# Patient Record
Sex: Female | Born: 1965 | Race: White | Hispanic: No | Marital: Married | Smoking: Never smoker
Health system: Southern US, Community
[De-identification: ages and names within clinical notes are randomized; demographics above are authoritative.]

## PROBLEM LIST (undated history)

## (undated) DIAGNOSIS — R059 Cough, unspecified: Secondary | ICD-10-CM

## (undated) DIAGNOSIS — R05 Cough: Secondary | ICD-10-CM

## (undated) DIAGNOSIS — I10 Essential (primary) hypertension: Secondary | ICD-10-CM

## (undated) HISTORY — DX: Essential (primary) hypertension: I10

## (undated) HISTORY — PX: OOPHORECTOMY: SHX86

---

## 1997-12-13 HISTORY — PX: ECTOPIC PREGNANCY SURGERY: SHX613

## 2004-01-27 ENCOUNTER — Other Ambulatory Visit: Payer: Self-pay

## 2005-08-19 ENCOUNTER — Ambulatory Visit: Payer: Self-pay | Admitting: Internal Medicine

## 2006-12-29 ENCOUNTER — Ambulatory Visit: Payer: Self-pay | Admitting: Obstetrics and Gynecology

## 2007-01-04 ENCOUNTER — Ambulatory Visit: Payer: Self-pay | Admitting: Obstetrics and Gynecology

## 2007-01-11 ENCOUNTER — Ambulatory Visit: Payer: Self-pay | Admitting: General Surgery

## 2007-01-11 HISTORY — PX: BREAST BIOPSY: SHX20

## 2007-09-06 DIAGNOSIS — F411 Generalized anxiety disorder: Secondary | ICD-10-CM | POA: Insufficient documentation

## 2009-01-01 ENCOUNTER — Ambulatory Visit: Payer: Self-pay | Admitting: Obstetrics and Gynecology

## 2009-03-04 ENCOUNTER — Ambulatory Visit: Payer: Self-pay | Admitting: Obstetrics and Gynecology

## 2009-05-22 ENCOUNTER — Ambulatory Visit: Payer: Self-pay | Admitting: Obstetrics and Gynecology

## 2009-05-30 ENCOUNTER — Ambulatory Visit: Payer: Self-pay | Admitting: Obstetrics and Gynecology

## 2010-12-29 ENCOUNTER — Ambulatory Visit: Payer: Self-pay | Admitting: Obstetrics and Gynecology

## 2010-12-31 ENCOUNTER — Ambulatory Visit: Payer: Self-pay | Admitting: Obstetrics and Gynecology

## 2011-07-15 ENCOUNTER — Ambulatory Visit: Payer: Self-pay | Admitting: Surgery

## 2012-01-04 ENCOUNTER — Ambulatory Visit: Payer: Self-pay | Admitting: Surgery

## 2012-01-06 ENCOUNTER — Ambulatory Visit: Payer: Self-pay | Admitting: Surgery

## 2012-07-29 IMAGING — MG MAM DGTL DIAGNOSTIC MAMMO W/CAD
1 series · 8 of 8 positions shown · non-contrast
Comparison: none

REASON FOR EXAM: LUMP OR MASS IN BRST FU AND YEARLY
COMMENTS:

[Series 8177: R CC · right · 8 of 8 slices shown]
[im 1/8]
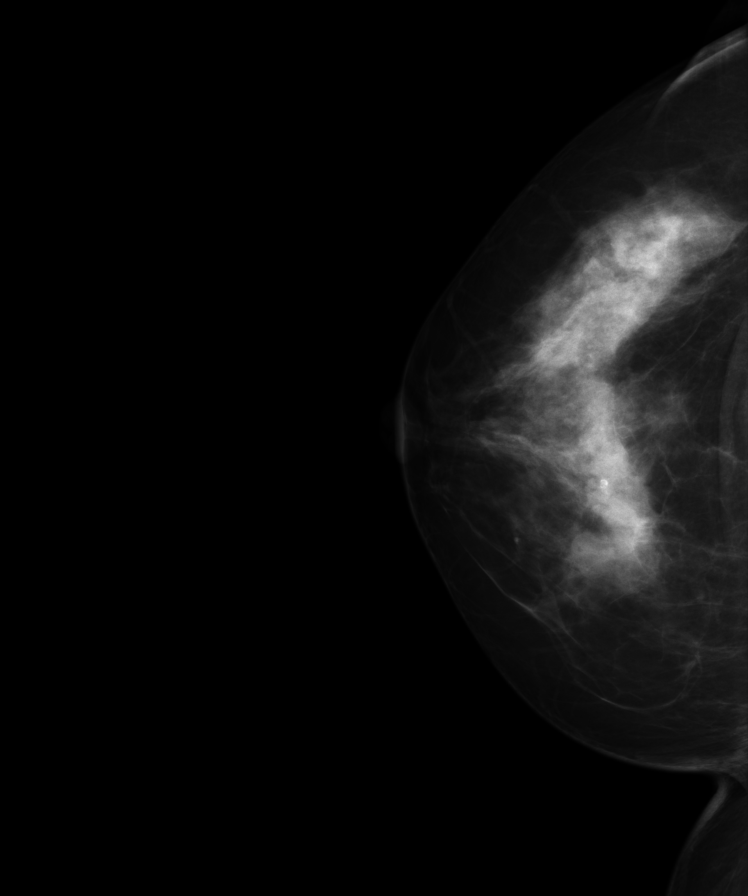
[im 2/8]
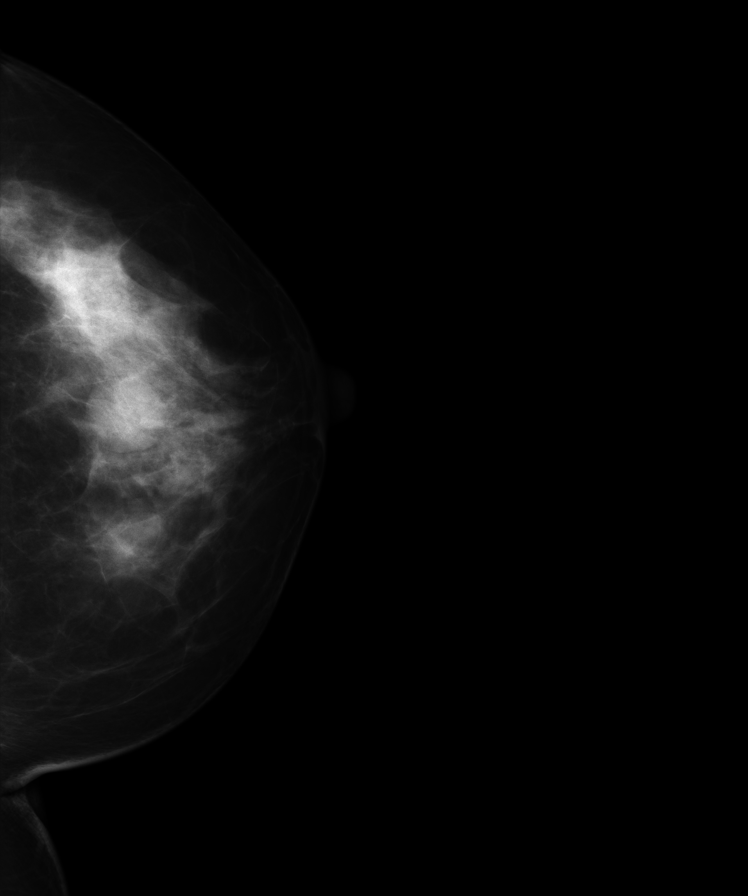
[im 3/8]
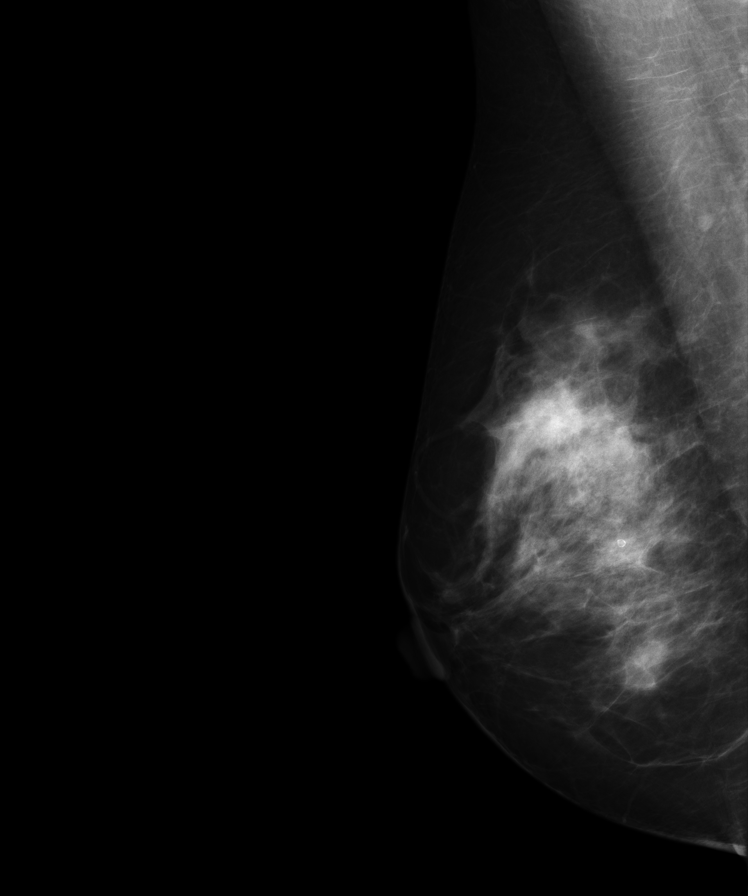
[im 4/8]
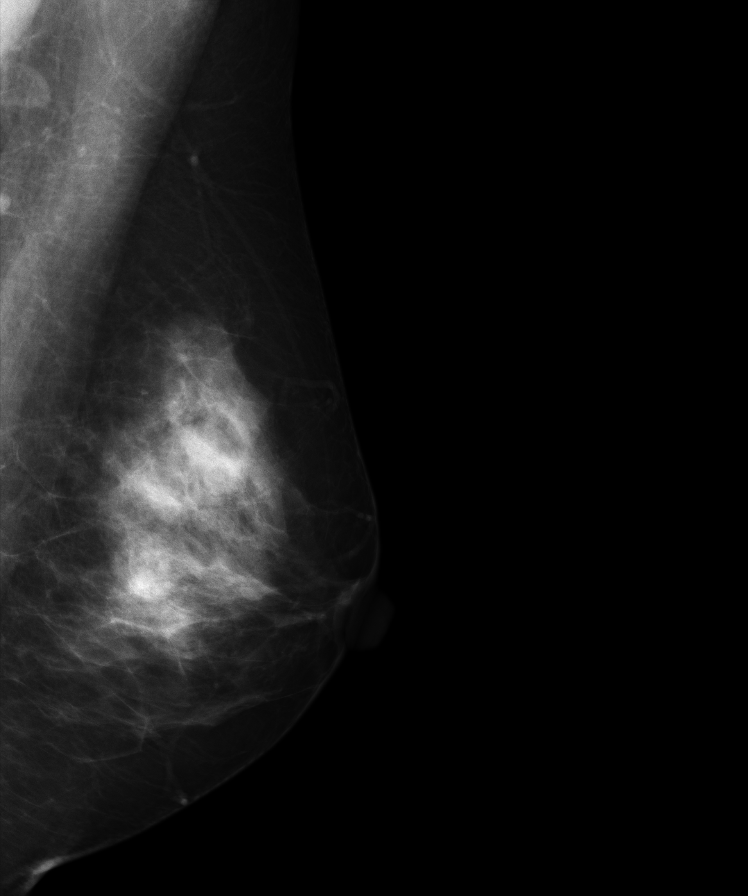
[im 5/8]
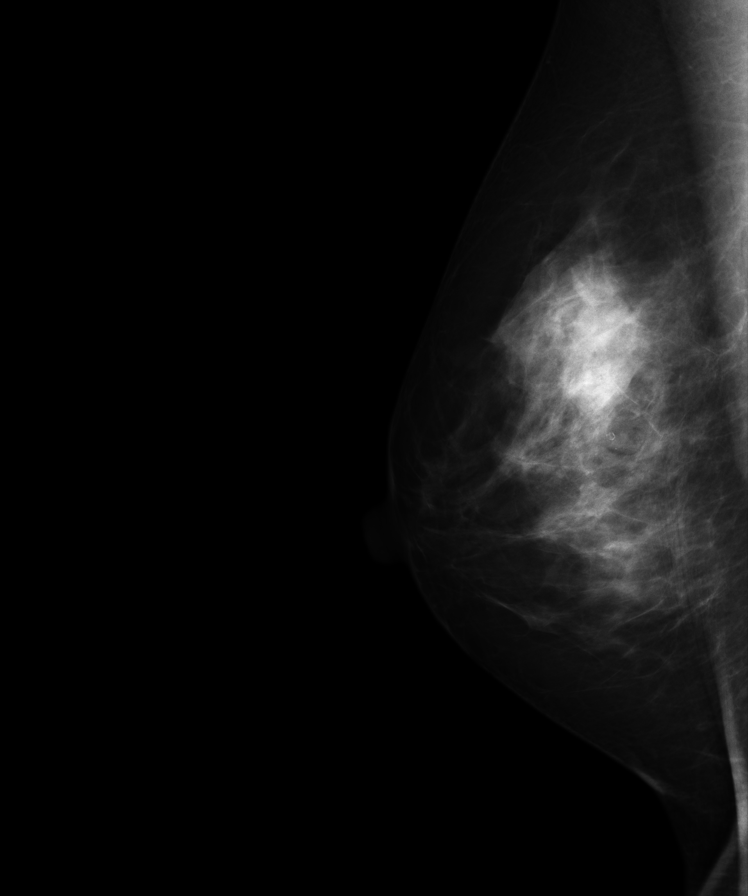
[im 6/8]
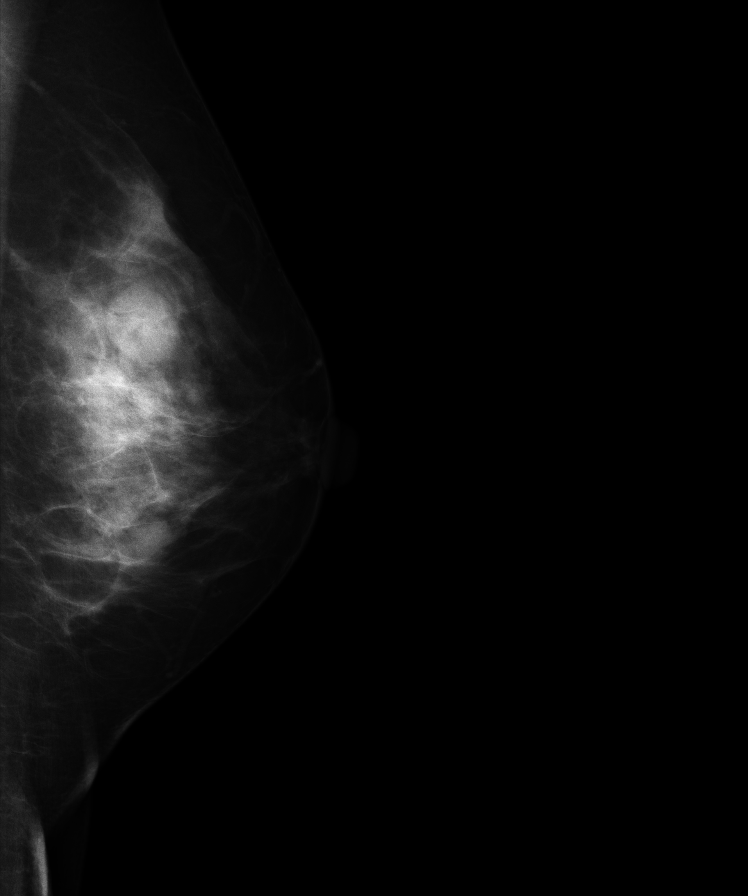
[im 7/8]
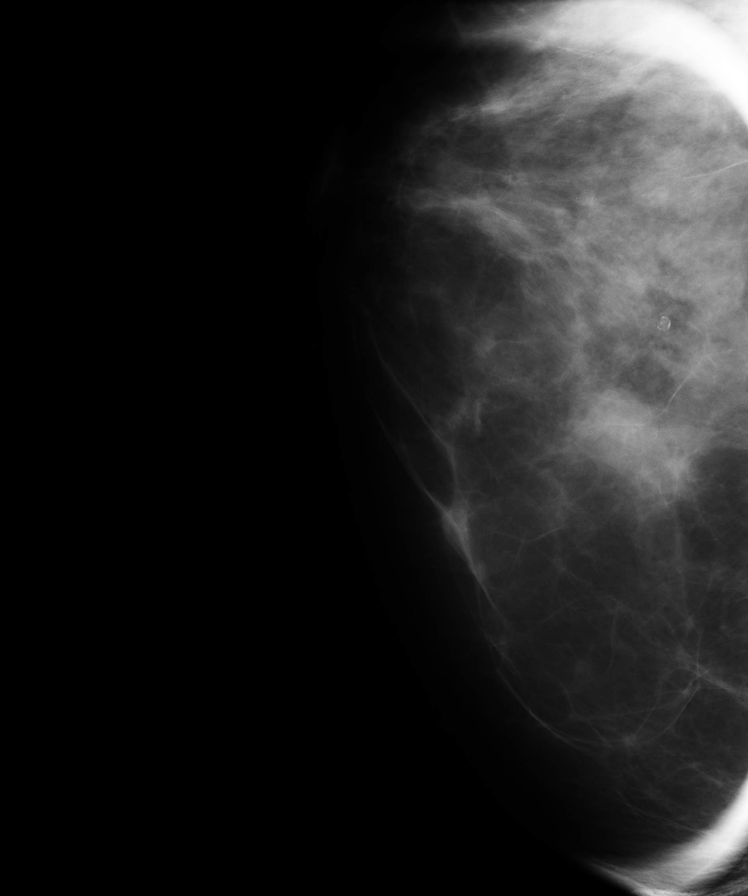
[im 8/8]
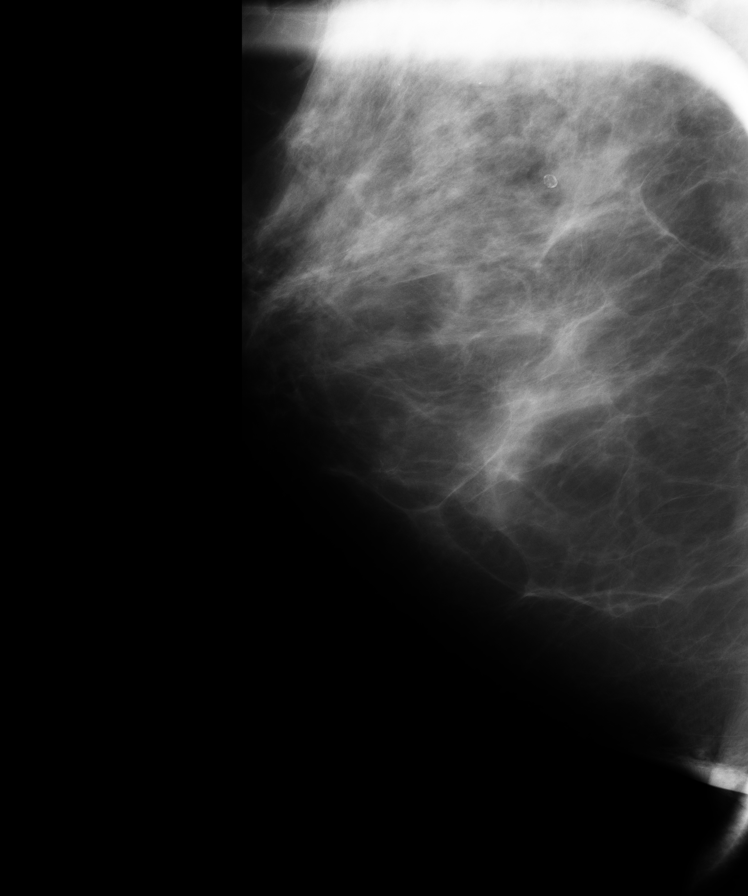

[8 of 8 positions shown; findings below may reference images not displayed]

PROCEDURE:     MAM - MAM DGTL DIAGNOSTIC MAMMO W/CAD  - January 04, 2012  [DATE]

RESULT:     Dense bilateral breasts are present with dense nodularity.
Nodularity in the right breast is stable. Ultrasound of the right breast was
performed and reveals changes most consistent with simple cyst and a
intramammary lymph node on today's examination. No evidence of significant
progression from prior ultrasound. The patient's right breast can now be
followed at six month intervals.  Noted in the left breast is a new, rounded
density. This is prominent. This most likely represents a cyst. Compression
spot films and ultrasound of the upper portion of the left breast should be
considered for further evaluation.
IMPRESSION: 1.Stable right breast by mammography and ultrasound. Follow-up right breast
mammogram is recommended in six months at this point.

2.Rounded prominent density that is new is present in the upper central
portion of the left breast. This is most likely a cyst. Compression spot
films and ultrasound of the left breast is suggested.

BI-RADS: Category 0-Needs Additional Imaging Evaluation

A NEGATIVE MAMMOGRAM REPORT DOES NOT PRECLUDE BIOPSY OR OTHER EVALUATION OF
A CLINICALLY PALPABLE OR OTHERWISE SUSPICIOUS MASS OR LESION. BREAST CANCER
MAY NOT BE DETECTED BY MAMMOGRAPHY IN UP TO 10% OF CASES.

## 2012-07-31 IMAGING — MG MAM DGTL [HOSPITAL] ADD VIEWS LT DIAG
1 series · 2 of 2 positions shown · non-contrast
Comparison: none

REASON FOR EXAM: AV LT ROUNDED DENSITY
COMMENTS:

PROCEDURE:     MAM - MAM DGTL [HOSPITAL] ADD VIEWS LT DIAG  - January 06, 2012  [DATE]
RESULT:     The left breast reveals rounded densities in the left breast.
Ultrasound was performed and reveals multiple cysts. The largest measures
approximately 2 cm.

[Series 9747: L CC · left · 2 of 2 slices shown]
[im 1/2]
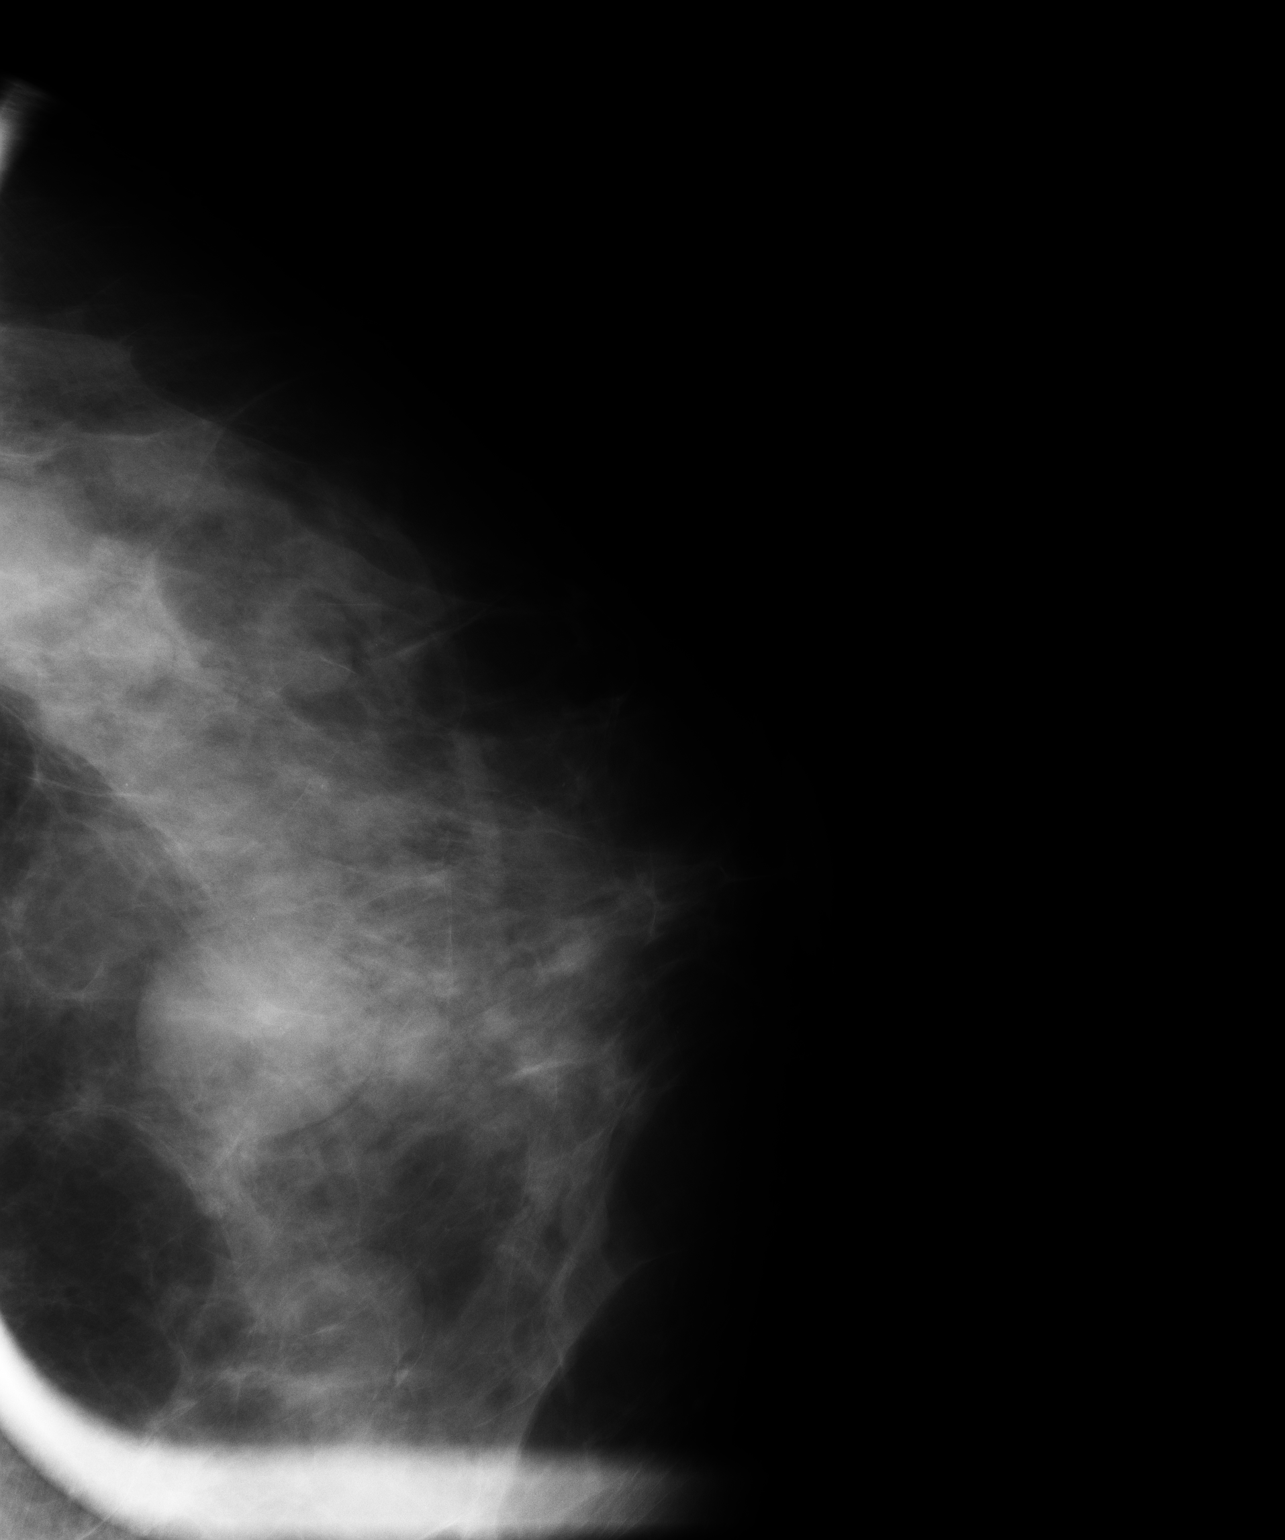
[im 2/2]
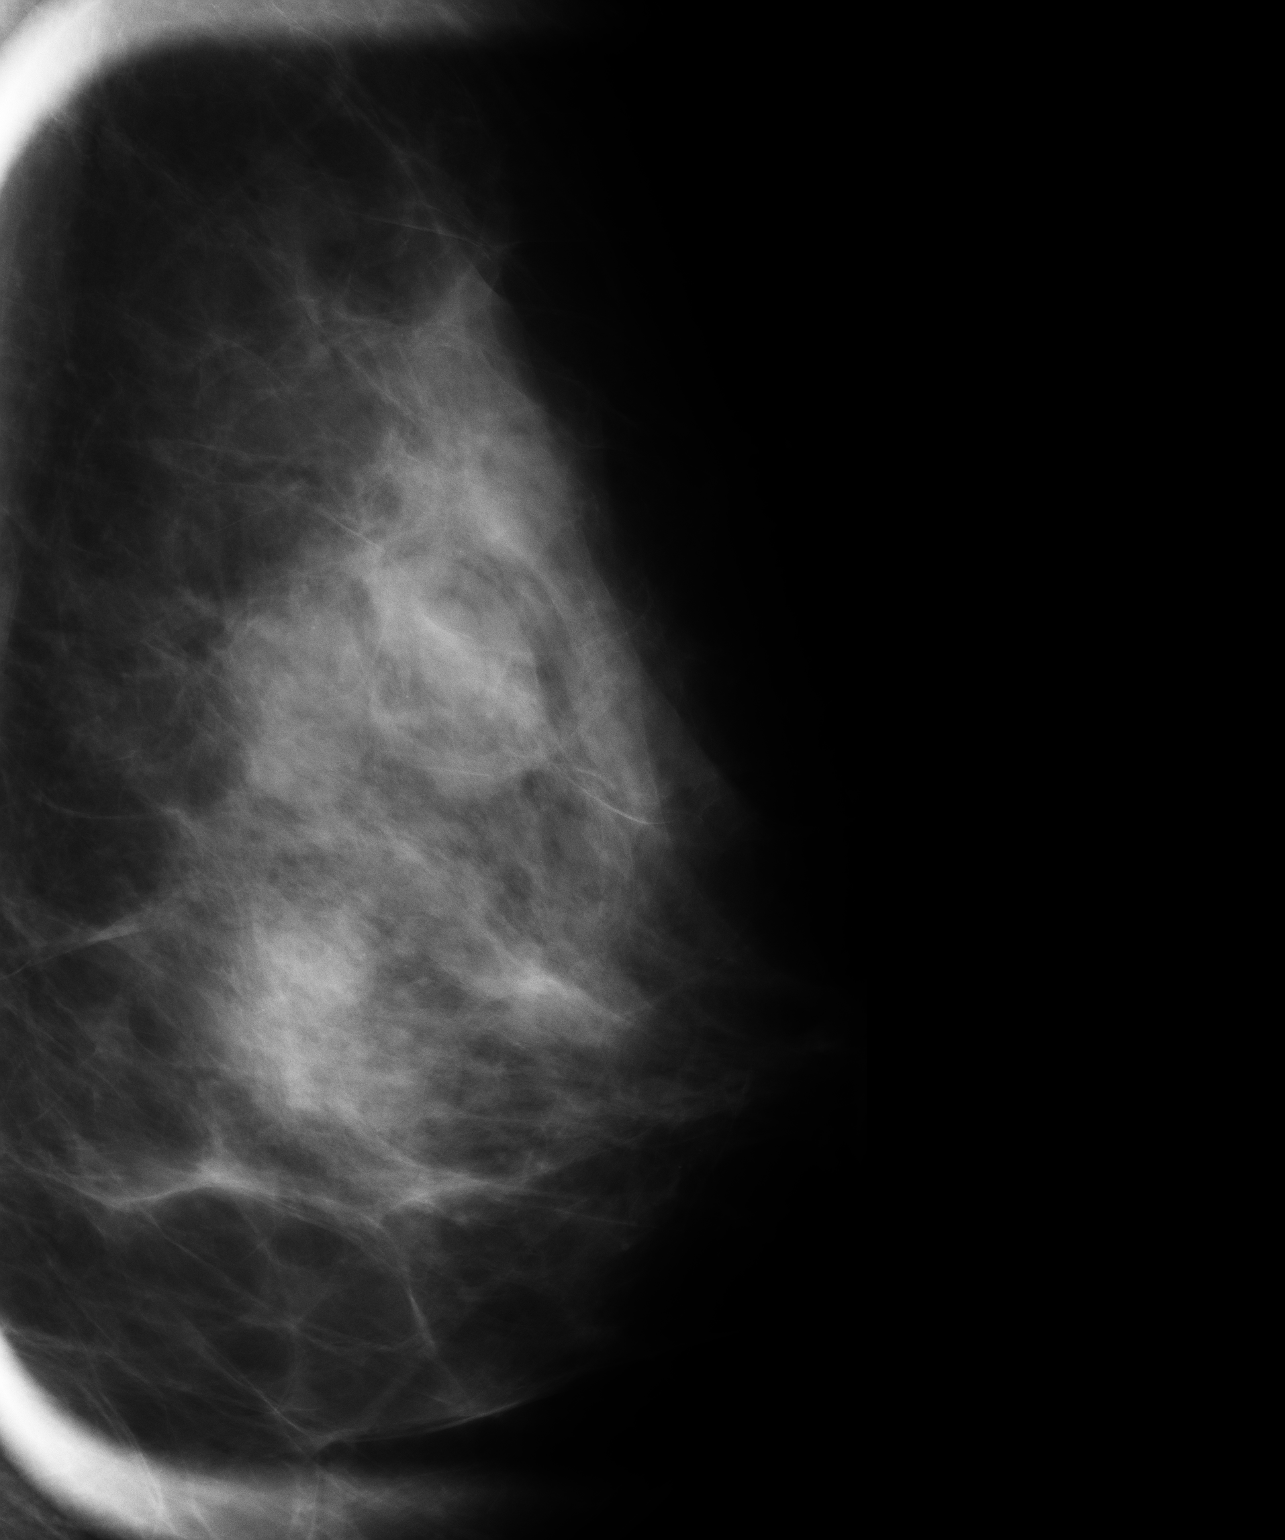

[2 of 2 positions shown; findings below may reference images not displayed]

IMPRESSION: 1.Cysts present in the left breast. No further evaluation is needed. Yearly
follow-up exams can be obtained.

BI-RADS: Category 2 - Benign Findings

A NEGATIVE MAMMOGRAM REPORT DOES NOT PRECLUDE BIOPSY OR OTHER EVALUATION OF
A CLINICALLY PALPABLE OR OTHERWISE SUSPICIOUS MASS OR LESION. BREAST CA[HOSPITAL]ER
MAY NOT BE DETECTED BY MAMMOGRAPHY IN UP TO 10% OF CASES.

## 2015-07-29 ENCOUNTER — Encounter: Admission: RE | Payer: Self-pay | Source: Ambulatory Visit

## 2015-07-29 ENCOUNTER — Ambulatory Visit: Admission: RE | Admit: 2015-07-29 | Payer: Self-pay | Source: Ambulatory Visit | Admitting: Unknown Physician Specialty

## 2015-07-29 HISTORY — PX: COLONOSCOPY: SHX174

## 2015-07-29 SURGERY — COLONOSCOPY WITH PROPOFOL
Anesthesia: General

## 2015-11-09 ENCOUNTER — Emergency Department: Payer: PRIVATE HEALTH INSURANCE

## 2015-11-09 ENCOUNTER — Encounter: Payer: Self-pay | Admitting: Emergency Medicine

## 2015-11-09 ENCOUNTER — Emergency Department
Admission: EM | Admit: 2015-11-09 | Discharge: 2015-11-09 | Disposition: A | Discharge status: Home / Self Care | Payer: PRIVATE HEALTH INSURANCE | Attending: Emergency Medicine | Admitting: Emergency Medicine

## 2015-11-09 DIAGNOSIS — K2970 Gastritis, unspecified, without bleeding: Secondary | ICD-10-CM | POA: Insufficient documentation

## 2015-11-09 DIAGNOSIS — K219 Gastro-esophageal reflux disease without esophagitis: Secondary | ICD-10-CM | POA: Diagnosis not present

## 2015-11-09 DIAGNOSIS — R531 Weakness: Secondary | ICD-10-CM | POA: Diagnosis not present

## 2015-11-09 DIAGNOSIS — R079 Chest pain, unspecified: Secondary | ICD-10-CM | POA: Diagnosis present

## 2015-11-09 HISTORY — DX: Cough: R05

## 2015-11-09 HISTORY — DX: Cough, unspecified: R05.9

## 2015-11-09 LAB — BASIC METABOLIC PANEL
Anion gap: 7 (ref 5–15)
BUN: 10 mg/dL (ref 6–20)
CHLORIDE: 103 mmol/L (ref 101–111)
CO2: 30 mmol/L (ref 22–32)
Calcium: 9.2 mg/dL (ref 8.9–10.3)
Creatinine, Ser: 0.68 mg/dL (ref 0.44–1.00)
GFR calc Af Amer: >60 mL/min (ref >60)
GFR calc non Af Amer: >60 mL/min (ref >60)
GLUCOSE: 114 mg/dL — AB (ref 65–99)
POTASSIUM: 3.8 mmol/L (ref 3.5–5.1)
Sodium: 140 mmol/L (ref 135–145)

## 2015-11-09 LAB — CBC
HEMATOCRIT: 40.1 % (ref 35.0–47.0)
Hemoglobin: 13.2 g/dL (ref 12.0–16.0)
MCH: 29.8 pg (ref 26.0–34.0)
MCHC: 33.1 g/dL (ref 32.0–36.0)
MCV: 90 fL (ref 80.0–100.0)
Platelets: 280 10*3/uL (ref 150–440)
RBC: 4.45 MIL/uL (ref 3.80–5.20)
RDW: 13.2 % (ref 11.5–14.5)
WBC: 10.2 10*3/uL (ref 3.6–11.0)

## 2015-11-09 LAB — TROPONIN I: Troponin I: <0.03 ng/mL (ref <0.031)

## 2015-11-09 MED ORDER — SUCRALFATE 1 G PO TABS: 1.0000 g | ORAL_TABLET | Freq: Four times a day (QID) | ORAL | Status: DC

## 2015-11-09 MED ORDER — RANITIDINE HCL 150 MG PO CAPS: 300.0000 mg | ORAL_CAPSULE | Freq: Two times a day (BID) | ORAL | Status: DC

## 2015-11-09 MED ORDER — PB-HYOSCY-ATROPINE-SCOPOLAMINE 16.2 MG PO TABS: 1.0000 | ORAL_TABLET | Freq: Three times a day (TID) | ORAL | Status: DC | PRN

## 2015-11-09 NOTE — ED Provider Notes (Signed)
[facility] Emergency Department Provider Note  ____________________________________________  Time seen: 4:00 PM  I have reviewed the triage vital signs and the nursing notes.   HISTORY  Chief Complaint Chest Pain and Weakness    HPI Heather Mcmahon is a 49 y.o. female who complains of intermittent dull central chest discomfort that started at approximately 1 PM today. It is worse with movement and change in position, better with lying still. It is intermittent, not exertional, not pleuritic. No associated vomiting diaphoresis or shortness of breath. No radiation. Has a history of gastritis found on endoscopy. Take Zantac 150 twice a day. Has also been on 3 courses of steroids over the last few months, most recently ending 2 days ago.  Over this time with repeated follow-up with the physician assistant and her primary care office, they found her blood pressure to be elevated and started her on HCTZ as well. At present she is symptom-free. No exertional symptoms at all.     Past Medical History  Diagnosis Date  . Cough      Patient Active Problem List   Diagnosis Date Noted  . ANXIETY 09/06/2007     Past Surgical History  Procedure Laterality Date  . Ectopic pregnancy surgery  1999  . Oophorectomy      right ovary due to cyst     Current Outpatient Rx  Name  Route  Sig  Dispense  Refill  . belladona alk-PHENObarbital (DONNATAL) 16.2 MG tablet   Oral   Take 1 tablet by mouth every 8 (eight) hours as needed.   20 tablet   0   . ranitidine (ZANTAC) 150 MG capsule   Oral   Take 2 capsules (300 mg total) by mouth 2 (two) times daily.   60 capsule   0   . sucralfate (CARAFATE) 1 G tablet   Oral   Take 1 tablet (1 g total) by mouth 4 (four) times daily.   120 tablet   1      Allergies Review of patient's allergies indicates no known allergies.   History reviewed. No pertinent family history.  Social History Social History   Substance Use Topics  . Smoking status: Never Smoker   . Smokeless tobacco: None  . Alcohol Use: No    Review of Systems  Constitutional:   No fever or chills. No weight changes Eyes:   No blurry vision or double vision.  ENT:   No sore throat. Cardiovascular:   Positive as above chest pain. Respiratory:  No dyspnea, positive nonproductive cough, worse at night when lying flat. This is been particularly bad recently. Gastrointestinal:   Negative for abdominal pain, vomiting and diarrhea.  No BRBPR or melena. Genitourinary:   Negative for dysuria, urinary retention, bloody urine, or difficulty urinating. Musculoskeletal:   Negative for back pain. No joint swelling or pain. Skin:   Negative for rash. Neurological:   Negative for headaches, focal weakness or numbness. Psychiatric:  No anxiety or depression.   Endocrine:  No hot/cold intolerance, changes in energy, or sleep difficulty.  10-point ROS otherwise negative.  ____________________________________________   PHYSICAL EXAM:  VITAL SIGNS: ED Triage Vitals  Enc Vitals Group     BP 11/09/15 1537 175/100 mmHg     Pulse Rate 11/09/15 1537 92     Resp 11/09/15 1537 20     Temp 11/09/15 1537 98.1 F (36.7 C)     Temp Source 11/09/15 1537 Oral     SpO2 11/09/15 1537  98 %     Weight 11/09/15 1537 174 lb (78.926 kg)     Height 11/09/15 1537 $RemoveBefor'5\' 3"'[id number]$  (1.6 m)     Head Cir --      Peak Flow --      Pain Score 11/09/15 1537 8     Pain Loc --      Pain Edu? --      Excl. in Colon? --      Constitutional:   Alert and oriented. Well appearing and in no distress. Eyes:   No scleral icterus. No conjunctival pallor. PERRL. EOMI ENT   Head:   Normocephalic and atraumatic.   Nose:   No congestion/rhinnorhea. No septal hematoma   Mouth/Throat:   MMM, diffuse dull pharyngeal erythema. No peritonsillar mass. No uvula shift.   Neck:   No stridor. No SubQ emphysema. No meningismus. Hematological/Lymphatic/Immunilogical:   No  cervical lymphadenopathy. Cardiovascular:   RRR. Normal and symmetric distal pulses are present in all extremities. No murmurs, rubs, or gallops. Respiratory:   Normal respiratory effort without tachypnea nor retractions. Breath sounds are clear and equal bilaterally. No wheezes/rales/rhonchi. Gastrointestinal:   Soft and nontender. No distention. There is no CVA tenderness.  No rebound, rigidity, or guarding. Genitourinary:   deferred Musculoskeletal:   Nontender with normal range of motion in all extremities. No joint effusions.  No lower extremity tenderness.  No edema. Chest wall nontender Neurologic:   Normal speech and language.  CN 2-10 normal. Motor grossly intact. No pronator drift.  Normal gait. No gross focal neurologic deficits are appreciated.  Skin:    Skin is warm, dry and intact. No rash noted.  No petechiae, purpura, or bullae. Psychiatric:   Mood and affect are normal. Speech and behavior are normal. Patient exhibits appropriate insight and judgment.  ____________________________________________    LABS (pertinent positives/negatives) (all labs ordered are listed, but only abnormal results are displayed) Labs Reviewed  BASIC METABOLIC PANEL - Abnormal; Notable for the following:    Glucose, Bld 114 (*)    All other components within normal limits  CBC  TROPONIN I   ____________________________________________   EKG  Interpreted by me First EKG at 3:37 PM:  Date: 11/09/2015  Rate: 84  Rhythm: normal sinus rhythm  QRS Axis: normal  Intervals: normal  ST/T Wave abnormalities: normal  Conduction Disutrbances: none  Narrative Interpretation: unremarkable  Repeat EKG at 4:08 PM:  Date: 11/09/2015  Rate: 81  Rhythm: normal sinus rhythm  QRS Axis: normal  Intervals: normal  ST/T Wave abnormalities: normal  Conduction Disutrbances: none  Narrative Interpretation: unremarkable        ____________________________________________     RADIOLOGY  Chest x-ray unremarkable  ____________________________________________   PROCEDURES   ____________________________________________   INITIAL IMPRESSION / ASSESSMENT AND PLAN / ED COURSE  Pertinent labs & imaging results that were available during my care of the patient were reviewed by me and considered in my medical decision making (see chart for details).  Patient presents with atypical chest pain. She does not have any risk factors for ACS such as hypertension diabetes hyperlipidemia or smoking or prior CAD. I actually think that her recent diagnosis of hypertension and being started on HCTZ is likely iatrogenic due to her persistent steroid use. Similarly I think the prolonged steroid exposure is making her gastritis worse which is why she is having increased coughing which is been causing chest discomfort as well. Her physical exam is consistent with gastritis and GERD. We'll start her on Carafate  and double her Zantac, and Donnatal when necessary. Follow-up with primary care. She states that she is to call the clinic tomorrow to schedule an appointment directly with her primary care doctor, Dr. Ginette Pitman.     ____________________________________________   FINAL CLINICAL IMPRESSION(S) / ED DIAGNOSES  Final diagnoses:  Gastritis  GERD    Carrie Mew, MD 11/09/15 [phone]

## 2015-11-09 NOTE — Discharge Instructions (Signed)

## 2015-11-09 NOTE — ED Notes (Signed)
Pt presents to ER with chest pain that started today and worsened within the last 2 hours located in central chest. Pain typically lasts a "a couple of minutes". Pt describes pain as dull, nagging pain that worsens when she moves  With relief when she lays down. Pt alert and oriented and in NAD.

## 2016-01-08 DIAGNOSIS — R7989 Other specified abnormal findings of blood chemistry: Secondary | ICD-10-CM | POA: Insufficient documentation

## 2016-01-08 DIAGNOSIS — I10 Essential (primary) hypertension: Secondary | ICD-10-CM

## 2016-01-08 DIAGNOSIS — J4520 Mild intermittent asthma, uncomplicated: Secondary | ICD-10-CM | POA: Insufficient documentation

## 2016-01-08 HISTORY — DX: Essential (primary) hypertension: I10

## 2016-02-26 ENCOUNTER — Encounter: Payer: Self-pay | Admitting: Physician Assistant

## 2016-02-26 ENCOUNTER — Ambulatory Visit: Payer: Self-pay | Admitting: Physician Assistant

## 2016-02-26 VITALS — BP 125/80 | HR 83 | Temp 98.3°F

## 2016-02-26 DIAGNOSIS — R52 Pain, unspecified: Secondary | ICD-10-CM

## 2016-02-26 DIAGNOSIS — J069 Acute upper respiratory infection, unspecified: Secondary | ICD-10-CM

## 2016-02-26 LAB — POCT INFLUENZA A/B
INFLUENZA A, POC: NEGATIVE
INFLUENZA B, POC: NEGATIVE

## 2016-02-26 MED ORDER — AMOXICILLIN 875 MG PO TABS: 875.0000 mg | ORAL_TABLET | Freq: Two times a day (BID) | ORAL | Status: DC

## 2016-02-26 NOTE — Progress Notes (Signed)
S: C/o runny nose and congestion for 2 days, some  fever, chills last night, denies cp/sob, v/d; cough is dry, says her eyes hurt  O: PE: vitals wnl, nad, perrl eomi, normocephalic, tms dull, nasal mucosa red and swollen, throat injected, neck supple no lymph, lungs c t a, cv rrr, neuro intact, flu swab neg  A:  Acute uri   P: amoxil 875mg  bid ; drink fluids, continue regular meds , use otc meds of choice, return if not improving in 5 days, return earlier if worsening

## 2016-07-30 ENCOUNTER — Other Ambulatory Visit: Payer: Self-pay | Admitting: Internal Medicine

## 2016-07-30 DIAGNOSIS — Z1231 Encounter for screening mammogram for malignant neoplasm of breast: Secondary | ICD-10-CM

## 2016-08-17 ENCOUNTER — Ambulatory Visit
Admission: RE | Admit: 2016-08-17 | Discharge: 2016-08-17 | Disposition: A | Discharge status: Home / Self Care | Payer: Managed Care, Other (non HMO) | Source: Ambulatory Visit | Attending: Internal Medicine | Admitting: Internal Medicine

## 2016-08-17 ENCOUNTER — Other Ambulatory Visit: Payer: Self-pay | Admitting: Internal Medicine

## 2016-08-17 DIAGNOSIS — Z1231 Encounter for screening mammogram for malignant neoplasm of breast: Secondary | ICD-10-CM | POA: Diagnosis not present

## 2016-08-17 DIAGNOSIS — R928 Other abnormal and inconclusive findings on diagnostic imaging of breast: Secondary | ICD-10-CM | POA: Insufficient documentation

## 2016-08-30 ENCOUNTER — Other Ambulatory Visit: Payer: Self-pay | Admitting: Internal Medicine

## 2016-08-30 DIAGNOSIS — N6310 Unspecified lump in the right breast, unspecified quadrant: Secondary | ICD-10-CM

## 2016-08-30 DIAGNOSIS — N6320 Unspecified lump in the left breast, unspecified quadrant: Secondary | ICD-10-CM

## 2016-08-31 ENCOUNTER — Ambulatory Visit
Admission: RE | Admit: 2016-08-31 | Discharge: 2016-08-31 | Disposition: A | Discharge status: Home / Self Care | Payer: Managed Care, Other (non HMO) | Source: Ambulatory Visit | Attending: Internal Medicine | Admitting: Internal Medicine

## 2016-08-31 DIAGNOSIS — N63 Unspecified lump in breast: Secondary | ICD-10-CM | POA: Diagnosis not present

## 2016-08-31 DIAGNOSIS — N6001 Solitary cyst of right breast: Secondary | ICD-10-CM | POA: Diagnosis not present

## 2016-08-31 DIAGNOSIS — N6320 Unspecified lump in the left breast, unspecified quadrant: Secondary | ICD-10-CM

## 2016-08-31 DIAGNOSIS — N6002 Solitary cyst of left breast: Secondary | ICD-10-CM | POA: Diagnosis not present

## 2016-08-31 DIAGNOSIS — N6310 Unspecified lump in the right breast, unspecified quadrant: Secondary | ICD-10-CM

## 2017-01-31 ENCOUNTER — Ambulatory Visit: Payer: Self-pay | Admitting: Physician Assistant

## 2017-01-31 DIAGNOSIS — Z0189 Encounter for other specified special examinations: Principal | ICD-10-CM

## 2017-01-31 DIAGNOSIS — Z008 Encounter for other general examination: Secondary | ICD-10-CM

## 2017-01-31 NOTE — Progress Notes (Signed)
Pt here to have biometric form filled out, has copy of labs from her doctor's office

## 2017-08-04 ENCOUNTER — Encounter: Payer: Self-pay | Admitting: Physician Assistant

## 2017-08-04 ENCOUNTER — Ambulatory Visit: Payer: Self-pay | Admitting: Physician Assistant

## 2017-08-04 VITALS — BP 110/70 | HR 74 | Temp 98.5°F | Resp 16

## 2017-08-04 DIAGNOSIS — S9030XA Contusion of unspecified foot, initial encounter: Secondary | ICD-10-CM

## 2017-08-04 NOTE — Progress Notes (Signed)
S: c/o left foot pain, kicked a door last night when trying to catch her dog, was ok last night but woke up this morning with swelling and pain, took some tylenol without relief  O: vitals wnl, nad, skin intact, no bruising, left foot tender along distal 5th metatarsal, full rom, n/v intact  A: contusion to left foot  P: ace wrap, wooden shoe, otc nsaid, if worsening will order xray

## 2017-08-09 ENCOUNTER — Other Ambulatory Visit: Payer: Self-pay | Admitting: Obstetrics and Gynecology

## 2017-08-09 DIAGNOSIS — N6320 Unspecified lump in the left breast, unspecified quadrant: Secondary | ICD-10-CM

## 2017-09-02 ENCOUNTER — Ambulatory Visit
Admission: RE | Admit: 2017-09-02 | Discharge: 2017-09-02 | Disposition: A | Discharge status: Home / Self Care | Payer: Managed Care, Other (non HMO) | Source: Ambulatory Visit | Attending: Obstetrics and Gynecology | Admitting: Obstetrics and Gynecology

## 2017-09-02 DIAGNOSIS — N6320 Unspecified lump in the left breast, unspecified quadrant: Secondary | ICD-10-CM

## 2017-10-13 ENCOUNTER — Ambulatory Visit: Payer: Self-pay | Admitting: Family

## 2017-10-13 VITALS — BP 139/70 | HR 98 | Temp 98.5°F | Resp 16

## 2017-10-13 DIAGNOSIS — J019 Acute sinusitis, unspecified: Secondary | ICD-10-CM

## 2017-10-13 MED ORDER — AMOXICILLIN 875 MG PO TABS: 875.0000 mg | ORAL_TABLET | Freq: Two times a day (BID) | ORAL | 0 refills | Status: DC

## 2017-10-13 NOTE — Progress Notes (Signed)
S/: Chronic allergies now with acute symptoms - ear pressure and fullness,frontal pressure, malaise and cough O/ mildly ill appearing vital signs are stable ENT TMs clear, nasal turbinates erythematous swollen with purulent rhinorrhea, pharynx clear, neck supple nontender, heart RSR lungs are clear  A/: Acute rhinosinusitis, chronic allergies  P/:Supportive measures discussed. Rx amoxicillin . Follow up prn not improving

## 2018-10-31 ENCOUNTER — Ambulatory Visit: Payer: Self-pay | Admitting: Emergency Medicine

## 2018-10-31 VITALS — BP 148/75 | HR 89 | Temp 98.5°F | Resp 14 | Ht 63.0 in | Wt 168.0 lb

## 2018-10-31 DIAGNOSIS — N938 Other specified abnormal uterine and vaginal bleeding: Secondary | ICD-10-CM | POA: Insufficient documentation

## 2018-10-31 DIAGNOSIS — R062 Wheezing: Secondary | ICD-10-CM

## 2018-10-31 MED ORDER — PREDNISONE 10 MG PO TABS: ORAL_TABLET | ORAL | 0 refills | Status: DC

## 2018-10-31 MED ORDER — SYMBICORT 160-4.5 MCG/ACT IN AERO: 2.0000 | INHALATION_SPRAY | Freq: Two times a day (BID) | RESPIRATORY_TRACT | 1 refills | Status: AC

## 2018-10-31 MED ORDER — IPRATROPIUM-ALBUTEROL 0.5-2.5 (3) MG/3ML IN SOLN
3.0000 mL | Freq: Once | RESPIRATORY_TRACT | Status: AC
  Administered 2018-10-31: 3 mL via RESPIRATORY_TRACT

## 2018-10-31 MED ORDER — BENZONATATE 100 MG PO CAPS
100.0000 mg | ORAL_CAPSULE | Freq: Three times a day (TID) | ORAL | 0 refills | Status: DC | PRN
Start: 2018-10-31 — End: 2020-05-13

## 2018-10-31 MED ORDER — ALBUTEROL SULFATE HFA 108 (90 BASE) MCG/ACT IN AERS: 2.0000 | INHALATION_SPRAY | Freq: Four times a day (QID) | RESPIRATORY_TRACT | 1 refills | Status: AC | PRN

## 2018-10-31 NOTE — Progress Notes (Signed)
Subjective. Patient volunteered at school approximately 10 days ago and came home with a respiratory illness.  3 days ago the patient developed initially rhinorrhea and a scratchy throat.  She has had no ear discomfort.  Following this she developed a cough which at times has been productive but she does not expectorate the phlegm.  She has not had fever or chills and has not felt bad.  Patient carries a diagnosis of asthma and is on Symbicort using it only once a day.  She did use her albuterol inhaler this morning which on review is expired.  She is not a smoker and does not vape.  She does not have a fever.  As noted she does carry a diagnosis of hypertension, and asthma, she is also has a diagnosis of reflux but not recently under treatment Objective. General.  Alert cooperative in no distress. TMs gray without fluid. Nose no drainage. Throat no redness or drainage. Chest: Wheezes noted on forced expiration.  Mild wheezes in both bases. Heart: Regular rate and rhythm. Extremities: No edema Assessment. Patient most likely picked up an acute upper respiratory infection which has triggered her asthma.  She only uses her Symbicort once a day and albuterol on a rare basis.  Her albuterol inhaler expired in June 2018.  We will give a nebulizer treatment here and then treat with a taper dose of steroids.  Refills were given to her Symbicort and albuterol.  She was also given Tessalon Perles for cough. Post nebulizer treatment patient significantly better with improved O2 of 98%.  She also felt significantly better. Plan. Prednisone 10 mg 6-day taper. Tessalon Perles 100 mg 1-2 3 times a day. Albuterol HFA 2 puffs every 6 as needed for chest tightness or wheezing. Symbicort 2 puffs twice a day. Patient advised to the emergency room or see her PCP if she develops high fever,worsening shortness of breath, or a  productive cough

## 2018-10-31 NOTE — Patient Instructions (Signed)
Take medication as instructed. Use inhalers as instructed. If you develop worsening shortness of breath, fever, or a productive cough please be seen in the emergency room or urgently by your primary care physician.    Asthma, Adult Cough, Adult Coughing is a reflex that clears your throat and your airways. Coughing helps to heal and protect your lungs. It is normal to cough occasionally, but a cough that happens with other symptoms or lasts a long time may be a sign of a condition that needs treatment. A cough may last only 2-3 weeks (acute), or it may last longer than 8 weeks (chronic). What are the causes? Coughing is commonly caused by:  Breathing in substances that irritate your lungs.  A viral or bacterial respiratory infection.  Allergies.  Asthma.  Postnasal drip.  Smoking.  Acid backing up from the stomach into the esophagus (gastroesophageal reflux).  Certain medicines.  Chronic lung problems, including COPD (or rarely, lung cancer).  Other medical conditions such as heart failure.  Follow these instructions at home: Pay attention to any changes in your symptoms. Take these actions to help with your discomfort:  Take medicines only as told by your health care provider. ? If you were prescribed an antibiotic medicine, take it as told by your health care provider. Do not stop taking the antibiotic even if you start to feel better. ? Talk with your health care provider before you take a cough suppressant medicine.  Drink enough fluid to keep your urine clear or pale yellow.  If the air is dry, use a cold steam vaporizer or humidifier in your bedroom or your home to help loosen secretions.  Avoid anything that causes you to cough at work or at home.  If your cough is worse at night, try sleeping in a semi-upright position.  Avoid cigarette smoke. If you smoke, quit smoking. If you need help quitting, ask your health care provider.  Avoid caffeine.  Avoid  alcohol.  Rest as needed.  Contact a health care provider if:  You have new symptoms.  You cough up pus.  Your cough does not get better after 2-3 weeks, or your cough gets worse.  You cannot control your cough with suppressant medicines and you are losing sleep.  You develop pain that is getting worse or pain that is not controlled with pain medicines.  You have a fever.  You have unexplained weight loss.  You have night sweats. Get help right away if:  You cough up blood.  You have difficulty breathing.  Your heartbeat is very fast. This information is not intended to replace advice given to you by your health care provider. Make sure you discuss any questions you have with your health care provider. Document Released: 05/28/2011 Document Revised: 05/06/2016 Document Reviewed: 02/05/2015 Elsevier Interactive Patient Education  2018 [employer]  Asthma is a condition of the lungs in which the airways tighten and narrow. Asthma can make it hard to breathe. Asthma cannot be cured, but medicine and lifestyle changes can help control it. Asthma may be started (triggered) by:  Animal skin flakes (dander).  Dust.  Cockroaches.  Pollen.  Mold.  Smoke.  Cleaning products.  Hair sprays or aerosol sprays.  Paint fumes or strong smells.  Cold air, weather changes, and winds.  Crying or laughing hard.  Stress.  Certain medicines or drugs.  Foods, such as dried fruit, potato chips, and sparkling grape juice.  Infections or conditions (colds, flu).  Exercise.  Certain medical  conditions or diseases.  Exercise or tiring activities.  Follow these instructions at home:  Take medicine as told by your doctor.  Use a peak flow meter as told by your doctor. A peak flow meter is a tool that measures how well the lungs are working.  Record and keep track of the peak flow meter's readings.  Understand and use the asthma action plan. An asthma action plan is a  written plan for taking care of your asthma and treating your attacks.  To help prevent asthma attacks: ? Do not smoke. Stay away from secondhand smoke. ? Change your heating and air conditioning filter often. ? Limit your use of fireplaces and wood stoves. ? Get rid of pests (such as roaches and mice) and their droppings. ? Throw away plants if you see mold on them. ? Clean your floors. Dust regularly. Use cleaning products that do not smell. ? Have someone vacuum when you are not home. Use a vacuum cleaner with a HEPA filter if possible. ? Replace carpet with wood, tile, or vinyl flooring. Carpet can trap animal skin flakes and dust. ? Use allergy-proof pillows, mattress covers, and box spring covers. ? Wash bed sheets and blankets every week in hot water and dry them in a dryer. ? Use blankets that are made of polyester or cotton. ? Clean bathrooms and kitchens with bleach. If possible, have someone repaint the walls in these rooms with mold-resistant paint. Keep out of the rooms that are being cleaned and painted. ? Wash hands often. Contact a doctor if:  You have make a whistling sound when breaking (wheeze), have shortness of breath, or have a cough even if taking medicine to prevent attacks.  The colored mucus you cough up (sputum) is thicker than usual.  The colored mucus you cough up changes from clear or white to yellow, green, gray, or bloody.  You have problems from the medicine you are taking such as: ? A rash. ? Itching. ? Swelling. ? Trouble breathing.  You need reliever medicines more than 2-3 times a week.  Your peak flow measurement is still at 50-79% of your personal best after following the action plan for 1 hour.  You have a fever. Get help right away if:  You seem to be worse and are not responding to medicine during an asthma attack.  You are short of breath even at rest.  You get short of breath when doing very little activity.  You have trouble  eating, drinking, or talking.  You have chest pain.  You have a fast heartbeat.  Your lips or fingernails start to turn blue.  You are light-headed, dizzy, or faint.  Your peak flow is less than 50% of your personal best. This information is not intended to replace advice given to you by your health care provider. Make sure you discuss any questions you have with your health care provider. Document Released: 05/17/2008 Document Revised: 05/06/2016 Document Reviewed: 06/28/2013 Elsevier Interactive Patient Education  2017 [employer].

## 2020-05-13 ENCOUNTER — Encounter: Payer: Self-pay | Admitting: Emergency Medicine

## 2020-05-13 ENCOUNTER — Other Ambulatory Visit: Payer: Self-pay

## 2020-05-13 ENCOUNTER — Emergency Department: Payer: Managed Care, Other (non HMO)

## 2020-05-13 ENCOUNTER — Inpatient Hospital Stay
Admission: EM | Admit: 2020-05-13 | Discharge: 2020-05-15 | DRG: 392 | Disposition: A | Discharge status: Home / Self Care | Payer: Managed Care, Other (non HMO) | Attending: Internal Medicine | Admitting: Internal Medicine

## 2020-05-13 DIAGNOSIS — Z7951 Long term (current) use of inhaled steroids: Secondary | ICD-10-CM

## 2020-05-13 DIAGNOSIS — K5792 Diverticulitis of intestine, part unspecified, without perforation or abscess without bleeding: Secondary | ICD-10-CM | POA: Diagnosis present

## 2020-05-13 DIAGNOSIS — Z20822 Contact with and (suspected) exposure to covid-19: Secondary | ICD-10-CM | POA: Diagnosis present

## 2020-05-13 DIAGNOSIS — Z79899 Other long term (current) drug therapy: Secondary | ICD-10-CM

## 2020-05-13 DIAGNOSIS — K5720 Diverticulitis of large intestine with perforation and abscess without bleeding: Secondary | ICD-10-CM | POA: Diagnosis present

## 2020-05-13 DIAGNOSIS — I10 Essential (primary) hypertension: Secondary | ICD-10-CM | POA: Diagnosis present

## 2020-05-13 DIAGNOSIS — Z803 Family history of malignant neoplasm of breast: Secondary | ICD-10-CM

## 2020-05-13 DIAGNOSIS — J4520 Mild intermittent asthma, uncomplicated: Secondary | ICD-10-CM | POA: Diagnosis present

## 2020-05-13 DIAGNOSIS — K8020 Calculus of gallbladder without cholecystitis without obstruction: Secondary | ICD-10-CM | POA: Diagnosis present

## 2020-05-13 DIAGNOSIS — E876 Hypokalemia: Secondary | ICD-10-CM | POA: Diagnosis present

## 2020-05-13 DIAGNOSIS — R109 Unspecified abdominal pain: Secondary | ICD-10-CM | POA: Diagnosis present

## 2020-05-13 LAB — CBC
HCT: 36.6 % (ref 36.0–46.0)
Hemoglobin: 12.3 g/dL (ref 12.0–15.0)
MCH: 30.4 pg (ref 26.0–34.0)
MCHC: 33.6 g/dL (ref 30.0–36.0)
MCV: 90.6 fL (ref 80.0–100.0)
Platelets: 292 10*3/uL (ref 150–400)
RBC: 4.04 MIL/uL (ref 3.87–5.11)
RDW: 12.1 % (ref 11.5–15.5)
WBC: 9.6 10*3/uL (ref 4.0–10.5)
nRBC: 0 % (ref 0.0–0.2)

## 2020-05-13 LAB — COMPREHENSIVE METABOLIC PANEL
ALT: 15 U/L (ref 0–44)
AST: 17 U/L (ref 15–41)
Albumin: 3.9 g/dL (ref 3.5–5.0)
Alkaline Phosphatase: 44 U/L (ref 38–126)
Anion gap: 9 (ref 5–15)
BUN: 10 mg/dL (ref 6–20)
CO2: 27 mmol/L (ref 22–32)
Calcium: 8.8 mg/dL — ABNORMAL LOW (ref 8.9–10.3)
Chloride: 103 mmol/L (ref 98–111)
Creatinine, Ser: 0.76 mg/dL (ref 0.44–1.00)
GFR calc Af Amer: >60 mL/min (ref >60)
GFR calc non Af Amer: >60 mL/min (ref >60)
Glucose, Bld: 110 mg/dL — ABNORMAL HIGH (ref 70–99)
Potassium: 3.4 mmol/L — ABNORMAL LOW (ref 3.5–5.1)
Sodium: 139 mmol/L (ref 135–145)
Total Bilirubin: 0.7 mg/dL (ref 0.3–1.2)
Total Protein: 7.3 g/dL (ref 6.5–8.1)

## 2020-05-13 LAB — URINALYSIS, COMPLETE (UACMP) WITH MICROSCOPIC
Bilirubin Urine: NEGATIVE
Glucose, UA: NEGATIVE mg/dL
Hgb urine dipstick: NEGATIVE
Ketones, ur: NEGATIVE mg/dL
Nitrite: NEGATIVE
Protein, ur: NEGATIVE mg/dL
Specific Gravity, Urine: 1.026 (ref 1.005–1.030)
pH: 6 (ref 5.0–8.0)

## 2020-05-13 LAB — LIPASE, BLOOD: Lipase: 28 U/L (ref 11–51)

## 2020-05-13 LAB — SARS CORONAVIRUS 2 BY RT PCR (HOSPITAL ORDER, PERFORMED IN ~~LOC~~ HOSPITAL LAB): SARS Coronavirus 2: NEGATIVE

## 2020-05-13 LAB — MRSA PCR SCREENING: MRSA by PCR: NEGATIVE

## 2020-05-13 LAB — POCT PREGNANCY, URINE: Preg Test, Ur: NEGATIVE

## 2020-05-13 MED ORDER — ONDANSETRON HCL 4 MG/2ML IJ SOLN: 4.0000 mg | Freq: Four times a day (QID) | INTRAMUSCULAR | Status: DC | PRN

## 2020-05-13 MED ORDER — PIPERACILLIN-TAZOBACTAM 3.375 G IVPB
3.3750 g | Freq: Three times a day (TID) | INTRAVENOUS | Status: DC
  Administered 2020-05-13 – 2020-05-15 (×5): 3.375 g via INTRAVENOUS
  Filled 2020-05-13 (×5): qty 50

## 2020-05-13 MED ORDER — MOMETASONE FURO-FORMOTEROL FUM 200-5 MCG/ACT IN AERO
2.0000 | INHALATION_SPRAY | Freq: Two times a day (BID) | RESPIRATORY_TRACT | Status: DC
  Administered 2020-05-13 – 2020-05-15 (×4): 2 via RESPIRATORY_TRACT
  Filled 2020-05-13: qty 8.8

## 2020-05-13 MED ORDER — MONTELUKAST SODIUM 10 MG PO TABS
10.0000 mg | ORAL_TABLET | Freq: Every day | ORAL | Status: DC
  Administered 2020-05-13 – 2020-05-14 (×2): 10 mg via ORAL
  Filled 2020-05-13 (×2): qty 1

## 2020-05-13 MED ORDER — ONDANSETRON HCL 4 MG/2ML IJ SOLN
4.0000 mg | Freq: Once | INTRAMUSCULAR | Status: AC
  Administered 2020-05-13: 4 mg via INTRAVENOUS
  Filled 2020-05-13: qty 2

## 2020-05-13 MED ORDER — LOSARTAN POTASSIUM-HCTZ 50-12.5 MG PO TABS: 1.0000 | ORAL_TABLET | Freq: Every day | ORAL | Status: DC

## 2020-05-13 MED ORDER — PIPERACILLIN-TAZOBACTAM 3.375 G IVPB 30 MIN
3.3750 g | Freq: Once | INTRAVENOUS | Status: AC
  Administered 2020-05-13: 3.375 g via INTRAVENOUS
  Filled 2020-05-13: qty 50

## 2020-05-13 MED ORDER — ONDANSETRON HCL 4 MG PO TABS: 4.0000 mg | ORAL_TABLET | Freq: Four times a day (QID) | ORAL | Status: DC | PRN

## 2020-05-13 MED ORDER — HYDROCHLOROTHIAZIDE 12.5 MG PO CAPS
12.5000 mg | ORAL_CAPSULE | Freq: Every day | ORAL | Status: DC
  Administered 2020-05-14: 12.5 mg via ORAL
  Filled 2020-05-13: qty 1

## 2020-05-13 MED ORDER — ADULT MULTIVITAMIN W/MINERALS CH
1.0000 | ORAL_TABLET | Freq: Every day | ORAL | Status: DC
  Administered 2020-05-14 – 2020-05-15 (×2): 1 via ORAL
  Filled 2020-05-13 (×2): qty 1

## 2020-05-13 MED ORDER — MORPHINE SULFATE (PF) 2 MG/ML IV SOLN
2.0000 mg | INTRAVENOUS | Status: AC | PRN
  Administered 2020-05-13 (×2): 2 mg via INTRAVENOUS
  Filled 2020-05-13 (×2): qty 1

## 2020-05-13 MED ORDER — MOMETASONE FURO-FORMOTEROL FUM 200-5 MCG/ACT IN AERO: 2.0000 | INHALATION_SPRAY | Freq: Two times a day (BID) | RESPIRATORY_TRACT | Status: DC

## 2020-05-13 MED ORDER — IOHEXOL 300 MG/ML  SOLN
100.0000 mL | Freq: Once | INTRAMUSCULAR | Status: AC | PRN
  Administered 2020-05-13: 100 mL via INTRAVENOUS

## 2020-05-13 MED ORDER — LOSARTAN POTASSIUM 50 MG PO TABS
50.0000 mg | ORAL_TABLET | Freq: Every day | ORAL | Status: DC
  Administered 2020-05-13 – 2020-05-15 (×3): 50 mg via ORAL
  Filled 2020-05-13 (×3): qty 1

## 2020-05-13 MED ORDER — PANTOPRAZOLE SODIUM 40 MG IV SOLR
40.0000 mg | INTRAVENOUS | Status: DC
  Administered 2020-05-13 – 2020-05-14 (×2): 40 mg via INTRAVENOUS
  Filled 2020-05-13 (×2): qty 40

## 2020-05-13 MED ORDER — ENOXAPARIN SODIUM 40 MG/0.4ML ~~LOC~~ SOLN
40.0000 mg | SUBCUTANEOUS | Status: DC
  Administered 2020-05-13 – 2020-05-14 (×2): 40 mg via SUBCUTANEOUS
  Filled 2020-05-13 (×2): qty 0.4

## 2020-05-13 MED ORDER — KCL IN DEXTROSE-NACL 20-5-0.45 MEQ/L-%-% IV SOLN
INTRAVENOUS | Status: DC
  Filled 2020-05-13 (×6): qty 1000

## 2020-05-13 NOTE — Consult Note (Signed)
Patient ID: Heather Mcmahon, female   DOB: [birth year], 54 y.o.   MRN: [id number]  HPI Heather Mcmahon is a 54 y.o. female presents to the emergency room with a 3-week history of intermittent lower abdominal pain that is colicky in nature and moderate intensity.  She also had associated diarrhea and decreased appetite.  No nausea no vomiting no fevers. CT scan personally reviewed showing evidence of diverticulitis with a small 1/2 cm abscess.  There is no evidence of free air.  She is able to perform more than 4 METS of activity without any shortness of breath or chest pain.  She did have a colonoscopy more than 5 years ago and is due for 1 this year.  Does have a history of diverticulitis in the family.  CBC and CMP were completely normal except mild hypokalemia.  She did have a history of ectopic pregnancy with prior oophorectomy.  HPI  Past Medical History:  Diagnosis Date  . Benign essential hypertension 01/08/2016  . Cough     Past Surgical History:  Procedure Laterality Date  . BREAST BIOPSY Right 01/11/2007  . ECTOPIC PREGNANCY SURGERY  1999  . OOPHORECTOMY     right ovary due to cyst    Family History  Problem Relation Age of Onset  . Breast cancer Neg Hx     Social History Social History   Tobacco Use  . Smoking status: Never Smoker  . Smokeless tobacco: Never Used  Substance Use Topics  . Alcohol use: No  . Drug use: No    No Known Allergies  Current Facility-Administered Medications  Medication Dose Route Frequency Provider Last Rate Last Admin  . dextrose 5 % and 0.45 % NaCl with KCl 20 mEq/L infusion   Intravenous Continuous Agbata, Tochukwu, MD 100 mL/hr at 05/13/20 1423 New Bag at 05/13/20 1423  . enoxaparin (LOVENOX) injection 40 mg  40 mg Subcutaneous Q24H Agbata, Tochukwu, MD      . Melene Muller ON 05/14/2020] hydrochlorothiazide (MICROZIDE) capsule 12.5 mg  12.5 mg Oral Daily Agbata, Tochukwu, MD      . losartan (COZAAR) tablet 50 mg  50 mg Oral Daily Agbata, Tochukwu,  MD   50 mg at 05/13/20 1442  . mometasone-formoterol (DULERA) 200-5 MCG/ACT inhaler 2 puff  2 puff Inhalation BID Agbata, Tochukwu, MD      . montelukast (SINGULAIR) tablet 10 mg  10 mg Oral QHS Agbata, Tochukwu, MD      . morphine 2 MG/ML injection 2 mg  2 mg Intravenous PRN Jene Every, MD   2 mg at 05/13/20 1209  . [START ON 05/14/2020] multivitamin with minerals tablet 1 tablet  1 tablet Oral Daily Agbata, Tochukwu, MD      . ondansetron (ZOFRAN) tablet 4 mg  4 mg Oral Q6H PRN Agbata, Tochukwu, MD       Or  . ondansetron (ZOFRAN) injection 4 mg  4 mg Intravenous Q6H PRN Agbata, Tochukwu, MD      . pantoprazole (PROTONIX) injection 40 mg  40 mg Intravenous Q24H Agbata, Tochukwu, MD   40 mg at 05/13/20 1442  . piperacillin-tazobactam (ZOSYN) IVPB 3.375 g  3.375 g Intravenous Q8H Tressie Ellis, [facility]       Current Outpatient Medications  Medication Sig Dispense Refill  . albuterol (PROVENTIL HFA;VENTOLIN HFA) 108 (90 Base) MCG/ACT inhaler Inhale 2 puffs into the lungs every 6 (six) hours as needed for wheezing or shortness of breath. 1 Inhaler 1  . losartan-hydrochlorothiazide (HYZAAR) 50-12.5 MG tablet  Take 1 tablet by mouth daily.    . medroxyPROGESTERone (PROVERA) 2.5 MG tablet Take by mouth.    . montelukast (SINGULAIR) 10 MG tablet Take by mouth.    . Multiple Vitamin (MULTI-VITAMINS) TABS Take by mouth.    . pantoprazole (PROTONIX) 40 MG tablet Take by mouth.    . SYMBICORT 160-4.5 MCG/ACT inhaler Inhale 2 puffs into the lungs 2 (two) times daily. 1 Inhaler 1  . Vitamin D, Ergocalciferol, (DRISDOL) 1.25 MG (50000 UT) CAPS capsule TAKE ONE CAPSULE BY MOUTH ONCE A WEEK       Review of Systems Full ROS  was asked and was negative except for the information on the HPI  Physical Exam Blood pressure (!) 136/92, pulse 84, temperature 98.8 F (37.1 C), temperature source Oral, resp. rate 18, height 5\' 3"  (1.6 m), weight 76.7 kg, last menstrual period 04/19/2020, SpO2 99  %. CONSTITUTIONAL: NAD EYES: Pupils are equal, round, and reactive to light, Sclera are non-icteric. EARS, NOSE, MOUTH AND THROAT: T she is wearing a mask, hearing is intact to voice. LYMPH NODES:  Lymph nodes in the neck are normal. RESPIRATORY:  Lungs are clear. There is normal respiratory effort, with equal breath sounds bilaterally, and without pathologic use of accessory muscles. CARDIOVASCULAR: Heart is regular without murmurs, gallops, or rubs. GI: The abdomen is soft, very mild tenderness to palpation in the suprapubic area and left lower quadrant without peritonitis, and nondistended. There are no palpable masses. There is no hepatosplenomegaly. There are normal bowel sounds in all quadrants. GU: Rectal deferred.   MUSCULOSKELETAL: Normal muscle strength and tone. No cyanosis or edema.   SKIN: Turgor is good and there are no pathologic skin lesions or ulcers. NEUROLOGIC: Motor and sensation is grossly normal. Cranial nerves are grossly intact. PSYCH:  Oriented to person, place and time. Affect is normal.  Data Reviewed  I have personally reviewed the patient's imaging, laboratory findings and medical records.    Assessment/Plan 54 year old female with acute on chronic diverticulitis and a small abscess.  Abscess is very small and is not worth draining.  She is clinically not toxic and her abdomen is benign.  Recommend IV broad-spectrum antibiotics and serial abdominal exams.  She will benefit from elective sigmoid colectomy after having a colonoscopy performed.  Only there is no need for emergent surgical intervention.  Extensive counseling provided.  If she were to deteriorate she understands that we may have to operate on her and possibly perform a Hartman's.  Caroleen Hamman, MD FACS General Surgeon 05/13/2020, 4:42 PM

## 2020-05-13 NOTE — ED Notes (Signed)
This rn attempted to call report. Receiving RN had not had chance to look at chart yet. This RN informed she would call back in 15 to see if there were any questions regarding pt. Transport request placed for patient at this time.

## 2020-05-13 NOTE — ED Triage Notes (Signed)
Pt was having diarrhea beginning of this month. Now c/o LLQ.  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  LLQ pain started last Wednesday.  Still having some diarrhea.  No vomiting or fever.

## 2020-05-13 NOTE — H&P (Signed)
History and Physical    [name]:[id number] DOB: [birth year] DOA: 05/13/2020  PCP: Patient, No Pcp Per   Patient coming from: Home  I have personally briefly reviewed patient's old medical records in [place]  Chief Complaint: Abdominal pain  HPI: Heather Mcmahon is a 54 y.o. female with medical history significant for hypertension and asthma who presents to the emergency room for evaluation of left lower quadrant pain which she has had for about a week.  She rates her pain an 8 by intensity at its worst.  Pain is nonradiating but has been persistent and associated with diarrhea.  She denies having any fever or chills, no nausea or vomiting.  She denies having any flank pain or urinary symptoms.  She has no cough, no shortness of breath or palpitations. She had a CT scan of abdomen pelvis done without contrast which showed sigmoid diverticulitis with a 1.6 x 1.6 x 1.4 cm peripherally enhancing fluid collection adjacent to the bowel compatible with a developing abscess. Small amount of free fluid within the anatomic pelvis is likely reactive. Cholelithiasis without cholecystitis.    ED Course: Patient is a 54 year old Caucasian female who presents to the emergency room for evaluation of left lower quadrant pain and found to have acute diverticulitis with developing abscess.  She will be admitted to the hospital for further evaluation.  Surgery has been consulted.   Review of Systems: As per HPI otherwise 10 point review of systems negative.    Past Medical History:  Diagnosis Date  . Benign essential hypertension 01/08/2016  . Cough     Past Surgical History:  Procedure Laterality Date  . BREAST BIOPSY Right 01/11/2007  . ECTOPIC PREGNANCY SURGERY  1999  . OOPHORECTOMY     right ovary due to cyst     reports that she has never smoked. She has never used smokeless tobacco. She reports that she does not drink alcohol or use drugs.  No Known Allergies  Family History   Problem Relation Age of Onset  . Breast cancer Neg Hx      Prior to Admission medications   Medication Sig Start Date End Date Taking? Authorizing Provider  losartan-hydrochlorothiazide (HYZAAR) 50-12.5 MG tablet Take 1 tablet by mouth daily. 04/26/20  Yes [provider]  montelukast (SINGULAIR) 10 MG tablet Take by mouth. 11/20/15 05/13/20 Yes [provider]  Multiple Vitamin (MULTI-VITAMINS) TABS Take by mouth.   Yes [provider]  pantoprazole (PROTONIX) 40 MG tablet Take by mouth. 07/28/17 05/13/20 Yes [provider]  SYMBICORT 160-4.5 MCG/ACT inhaler Inhale 2 puffs into the lungs 2 (two) times daily. 10/31/18  Yes Darlyne Russian, MD  Vitamin D, Ergocalciferol, (DRISDOL) 1.25 MG (50000 UT) CAPS capsule TAKE ONE CAPSULE BY MOUTH ONCE A WEEK 01/05/18  Yes [provider]  albuterol (PROVENTIL HFA;VENTOLIN HFA) 108 (90 Base) MCG/ACT inhaler Inhale 2 puffs into the lungs every 6 (six) hours as needed for wheezing or shortness of breath. 10/31/18   Darlyne Russian, MD  medroxyPROGESTERone (PROVERA) 2.5 MG tablet Take by mouth. 12/15/17 12/15/18  [provider]    Physical Exam: Vitals:   05/13/20 0839 05/13/20 0842 05/13/20 1055  BP:  (!) 129/49 (!) 136/92  Pulse:  89 84  Resp:  16 18  Temp:  98.8 F (37.1 C)   TempSrc:  Oral   SpO2:  98% 99%  Weight: 76.7 kg    Height: 5\' 3"  (1.6 m)  Vitals:   05/13/20 0839 05/13/20 0842 05/13/20 1055  BP:  (!) 129/49 (!) 136/92  Pulse:  89 84  Resp:  16 18  Temp:  98.8 F (37.1 C)   TempSrc:  Oral   SpO2:  98% 99%  Weight: 76.7 kg    Height: 5\' 3"  (1.6 m)      Constitutional: NAD, alert and oriented x 3.  Appears comfortable and in no distress Eyes: PERRL, lids and conjunctivae normal ENMT: Mucous membranes are moist.  Neck: normal, supple, no masses, no thyromegaly Respiratory: clear to auscultation bilaterally, no wheezing, no crackles. Normal respiratory effort. No accessory  muscle use.  Cardiovascular: Regular rate and rhythm, no murmurs / rubs / gallops. No extremity edema. 2+ pedal pulses. No carotid bruits.  Abdomen: Left lower quadrant pain, no masses palpated. No hepatosplenomegaly. Bowel sounds positive.  Musculoskeletal: no clubbing / cyanosis. No joint deformity upper and lower extremities.  Skin: no rashes, lesions, ulcers.  Neurologic: No gross focal neurologic deficit. Psychiatric: Normal mood and affect.   Labs on Admission: I have personally reviewed following labs and imaging studies  CBC: Recent Labs  Lab 05/13/20 0842  WBC 9.6  HGB 12.3  HCT 36.6  MCV 90.6  PLT 292   Basic Metabolic Panel: Recent Labs  Lab 05/13/20 0842  NA 139  K 3.4*  CL 103  CO2 27  GLUCOSE 110*  BUN 10  CREATININE 0.76  CALCIUM 8.8*   GFR: Estimated Creatinine Clearance: 79.7 mL/min (by C-G formula based on SCr of 0.76 mg/dL). Liver Function Tests: Recent Labs  Lab 05/13/20 0842  AST 17  ALT 15  ALKPHOS 44  BILITOT 0.7  PROT 7.3  ALBUMIN 3.9   Recent Labs  Lab 05/13/20 0842  LIPASE 28   No results for input(s): AMMONIA in the last 168 hours. Coagulation Profile: No results for input(s): INR, PROTIME in the last 168 hours. Cardiac Enzymes: No results for input(s): CKTOTAL, CKMB, CKMBINDEX, TROPONINI in the last 168 hours. BNP (last 3 results) No results for input(s): PROBNP in the last 8760 hours. HbA1C: No results for input(s): HGBA1C in the last 72 hours. CBG: No results for input(s): GLUCAP in the last 168 hours. Lipid Profile: No results for input(s): CHOL, HDL, LDLCALC, TRIG, CHOLHDL, LDLDIRECT in the last 72 hours. Thyroid Function Tests: No results for input(s): TSH, T4TOTAL, FREET4, T3FREE, THYROIDAB in the last 72 hours. Anemia Panel: No results for input(s): VITAMINB12, FOLATE, FERRITIN, TIBC, IRON, RETICCTPCT in the last 72 hours. Urine analysis:    Component Value Date/Time   COLORURINE AMBER (A) 05/13/2020 0843    APPEARANCEUR CLOUDY (A) 05/13/2020 0843   LABSPEC 1.026 05/13/2020 0843   PHURINE 6.0 05/13/2020 0843   GLUCOSEU NEGATIVE 05/13/2020 0843   HGBUR NEGATIVE 05/13/2020 0843   BILIRUBINUR NEGATIVE 05/13/2020 0843   KETONESUR NEGATIVE 05/13/2020 0843   PROTEINUR NEGATIVE 05/13/2020 0843   NITRITE NEGATIVE 05/13/2020 0843   LEUKOCYTESUR SMALL (A) 05/13/2020 0843    Radiological Exams on Admission: CT ABDOMEN PELVIS W CONTRAST  Result Date: 05/13/2020 CLINICAL DATA:  Left lower quadrant abdominal pain. Diverticulitis suspected. EXAM: CT ABDOMEN AND PELVIS WITH CONTRAST TECHNIQUE: Multidetector CT imaging of the abdomen and pelvis was performed using the standard protocol following bolus administration of intravenous contrast. CONTRAST:  07/13/2020 OMNIPAQUE IOHEXOL 300 MG/ML  SOLN COMPARISON:  None. FINDINGS: Lower chest: The lung bases are clear without focal nodule, mass, or airspace disease. The heart size is normal. No significant pleural or pericardial  effusions are present. Hepatobiliary: Liver is unremarkable. No focal lesions are present. Layering stones are present at the gallbladder neck. No inflammatory changes are present. The common bile duct is normal. Pancreas: Unremarkable. No pancreatic ductal dilatation or surrounding inflammatory changes. Spleen: Normal in size without focal abnormality. Adrenals/Urinary Tract: Adrenal glands are normal bilaterally. A simple cyst is present at the upper pole of the right kidney. Kidneys and ureters are otherwise within normal limits. No stone or mass lesion is present. No obstruction is present. The urinary bladder is within normal limits. Stomach/Bowel: The stomach and duodenum are within normal limits. Small bowel is unremarkable. Terminal ileum is within normal limits. The appendix is visualized and normal. The ascending transverse colon are normal. Mild diverticular changes are present in the distal descending colon. Marked inflammatory changes are present  at the mid sigmoid colon with extensive diverticula. A peripherally enhancing fluid collection adjacent to the bowel measures 1.6 x 1.6 x 1.4 cm. No other collections or free air are present. Rectum is normal. Vascular/Lymphatic: Aorta and branch vessels are within normal limits. Reproductive: Uterus and bilateral adnexa are unremarkable. Other: A small amount of free fluid is evident within the anatomic pelvis. No free air is present. Musculoskeletal: Vertebral body heights and alignment are normal. Bony pelvis is normal. Hips are located and within normal limits. IMPRESSION: 1. Sigmoid diverticulitis with a 1.6 x 1.6 x 1.4 cm peripherally enhancing fluid collection adjacent to the bowel compatible with a developing abscess. 2. Small amount of free fluid within the anatomic pelvis is likely reactive. 3. Cholelithiasis without cholecystitis. 4. Simple cyst at the upper pole of the right kidney. Electronically Signed   By: Marin Roberts M.D.   On: 05/13/2020 11:39    EKG: Independently reviewed.   Assessment/Plan Principal Problem:   Acute diverticulitis Active Problems:   Benign essential hypertension   Mild intermittent asthma   Hypokalemia    Acute diverticulitis With developing abscess (POA) Patient presents for evaluation of left lower quadrant pain which she has had for about a week and which has worsened. Imaging shows  left lower quadrant diverticulitis with a forming abscess We will start patient on Zosyn adjusted to renal function We will request surgical consult Keep patient n.p.o. for now   Hypokalemia Secondary to diuretic therapy Supplement potassium   Hypertension Continue HCTZ and losartan   Mild intermittent asthma Continue as needed bronchodilator therapy as well as inhaled steroids and Singulair excuse me    DVT prophylaxis: Lovenox Code Status: Full  Family Communication: Greater than 50% of time was spent discussing plan of care with patient at the  bedside.  All questions and concerns have been addressed.  She verbalizes understanding and agrees with the plan.  Disposition Plan: Back to previous home environment Consults called: Surgery    Kayd Launer MD [facility]     05/13/2020, 1:21 PM

## 2020-05-13 NOTE — Consult Note (Signed)
Pharmacy Antibiotic Note  Heather Mcmahon is a 54 y.o. female admitted on 05/13/2020 with intra-abdominal infection. CT abdomen pelvis with impression of diverticulitis with fluid collection adjacent to the bowel compatible with a developing abscess. Surgery consulted. Patient is afebrile, no leukocytosis.  Pharmacy has been consulted for Zosyn dosing.  Plan: Zosyn 3.375g IV q8h (4 hour infusion).  Height: 5\' 3"  (160 cm) Weight: 76.7 kg (169 lb) IBW/kg (Calculated) : 52.4  Temp (24hrs), Avg:98.8 F (37.1 C), Min:98.8 F (37.1 C), Max:98.8 F (37.1 C)  Recent Labs  Lab 05/13/20 0842  WBC 9.6  CREATININE 0.76    Estimated Creatinine Clearance: 79.7 mL/min (by C-G formula based on SCr of 0.76 mg/dL).    No Known Allergies  Antimicrobials this admission: Zosyn 6/1 >>   Dose adjustments this admission: N/A  Microbiology results:  Thank you for allowing pharmacy to be a part of this patients care.  8/1  Pharmacy Resident 05/13/2020 1:50 PM

## 2020-05-13 NOTE — ED Provider Notes (Signed)
[facility] Emergency Department Provider Note   ____________________________________________    I have reviewed the triage vital signs and the nursing notes.   HISTORY  Chief Complaint Abdominal Pain     HPI Heather Mcmahon is a 54 y.o. female who presents with complaints of left lower quadrant abdominal pain for nearly a week, pain has gradually worsened.  Has had diverticulitis before but it has been years.  Denies fevers or chills.  No nausea or vomiting.  Went to urgent care today and they were referred to the emergency department for further evaluation.  Has not taken anything for this.  Denies dysuria.  No flank pain or back pain.  Pain is moderate in intensity at this time  Past Medical History:  Diagnosis Date  . Benign essential hypertension 01/08/2016  . Cough     Patient Active Problem List   Diagnosis Date Noted  . DUB (dysfunctional uterine bleeding) 10/31/2018  . Benign essential hypertension 01/08/2016  . Low serum vitamin D 01/08/2016  . Mild intermittent asthma 01/08/2016  . ANXIETY 09/06/2007    Past Surgical History:  Procedure Laterality Date  . BREAST BIOPSY Right 01/11/2007  . ECTOPIC PREGNANCY SURGERY  1999  . OOPHORECTOMY     right ovary due to cyst    Prior to Admission medications   Medication Sig Start Date End Date Taking? Authorizing Provider  losartan-hydrochlorothiazide (HYZAAR) 50-12.5 MG tablet Take 1 tablet by mouth daily. 04/26/20  Yes [provider]  montelukast (SINGULAIR) 10 MG tablet Take by mouth. 11/20/15 05/13/20 Yes [provider]  Multiple Vitamin (MULTI-VITAMINS) TABS Take by mouth.   Yes [provider]  pantoprazole (PROTONIX) 40 MG tablet Take by mouth. 07/28/17 05/13/20 Yes [provider]  SYMBICORT 160-4.5 MCG/ACT inhaler Inhale 2 puffs into the lungs 2 (two) times daily. 10/31/18  Yes Collene Gobble, MD  Vitamin D, Ergocalciferol, (DRISDOL) 1.25 MG (50000 UT)  CAPS capsule TAKE ONE CAPSULE BY MOUTH ONCE A WEEK 01/05/18  Yes [provider]  albuterol (PROVENTIL HFA;VENTOLIN HFA) 108 (90 Base) MCG/ACT inhaler Inhale 2 puffs into the lungs every 6 (six) hours as needed for wheezing or shortness of breath. 10/31/18   Collene Gobble, MD  medroxyPROGESTERone (PROVERA) 2.5 MG tablet Take by mouth. 12/15/17 12/15/18  [provider]     Allergies Patient has no known allergies.  Family History  Problem Relation Age of Onset  . Breast cancer Neg Hx     Social History Social History   Tobacco Use  . Smoking status: Never Smoker  . Smokeless tobacco: Never Used  Substance Use Topics  . Alcohol use: No  . Drug use: No    Review of Systems  Constitutional: No fever/chills Eyes: No visual changes.  ENT: No sore throat. Cardiovascular: Denies chest pain. Respiratory: Denies shortness of breath. Gastrointestinal: As above Genitourinary: As above Musculoskeletal: Negative for back pain. Skin: Negative for rash. Neurological: Negative for headaches or weakness   ____________________________________________   PHYSICAL EXAM:  VITAL SIGNS: ED Triage Vitals  Enc Vitals Group     BP 05/13/20 0842 (!) 129/49     Pulse Rate 05/13/20 0842 89     Resp 05/13/20 0842 16     Temp 05/13/20 0842 98.8 F (37.1 C)     Temp Source 05/13/20 0842 Oral     SpO2 05/13/20 0842 98 %     Weight 05/13/20 0839 76.7 kg (169 lb)  Height 05/13/20 0839 1.6 m (5\' 3" )     Head Circumference --      Peak Flow --      Pain Score 05/13/20 0838 8     Pain Loc --      Pain Edu? --      Excl. in [place]? --     Constitutional: Alert and oriented.   Nose: No congestion/rhinnorhea. Mouth/Throat: Mucous membranes are moist.    Cardiovascular: Normal rate, regular rhythm. Kermit Balo peripheral circulation. Respiratory: Normal respiratory effort.  No retractions. Gastrointestinal: Tenderness palpation the left lower quadrant . No distention.  No CVA  tenderness.  Musculoskeletal: No lower extremity tenderness nor edema.  Warm and well perfused Neurologic:  Normal speech and language. No gross focal neurologic deficits are appreciated.  Skin:  Skin is warm, dry and intact. No rash noted. Psychiatric: Mood and affect are normal. Speech and behavior are normal.  ____________________________________________   LABS (all labs ordered are listed, but only abnormal results are displayed)  Labs Reviewed  COMPREHENSIVE METABOLIC PANEL - Abnormal; Notable for the following components:      Result Value   Potassium 3.4 (*)    Glucose, Bld 110 (*)    Calcium 8.8 (*)    All other components within normal limits  URINALYSIS, COMPLETE (UACMP) WITH MICROSCOPIC - Abnormal; Notable for the following components:   Color, Urine AMBER (*)    APPearance CLOUDY (*)    Leukocytes,Ua SMALL (*)    Bacteria, UA MANY (*)    All other components within normal limits  SARS CORONAVIRUS 2 BY RT PCR (HOSPITAL ORDER, [facility])  LIPASE, BLOOD  CBC  POC URINE PREG, ED  POCT PREGNANCY, URINE   ____________________________________________  EKG  None ____________________________________________  RADIOLOGY  CT abdomen pelvis demonstrates sigmoid diverticulitis with a fluid collection adjacent,consistent with abscess ____________________________________________   PROCEDURES  Procedure(s) performed: No  Procedures   Critical Care performed: No ____________________________________________   INITIAL IMPRESSION / ASSESSMENT AND PLAN / ED COURSE  Pertinent labs & imaging results that were available during my care of the patient were reviewed by me and considered in my medical decision making (see chart for details).  Patient presents with left lower quadrant abdominal pain as above.  Differential includes diverticulitis, ureterolithiasis, urinary tract infection, colitis.  Lab work is overall reassuring, normal white  blood cell count.  Urinalysis likely contaminated given squamous epithelial cells.  Sent for CT abdomen pelvis given tenderness in left lower quadrant.  CT abdomen pelvis demonstrates diverticulitis with developing abscess.  Discussed with Dr. Adora Fridge of surgery who recommends IV Zosyn and admission to medicine.  Discussed with hospitalist for admission    ____________________________________________   FINAL CLINICAL IMPRESSION(S) / ED DIAGNOSES  Final diagnoses:  Diverticulitis of large intestine with abscess without bleeding        Note:  This document was prepared using Dragon voice recognition software and may include unintentional dictation errors.   Lavonia Drafts, MD 05/13/20 1320

## 2020-05-14 DIAGNOSIS — K5720 Diverticulitis of large intestine with perforation and abscess without bleeding: Principal | ICD-10-CM

## 2020-05-14 DIAGNOSIS — K5792 Diverticulitis of intestine, part unspecified, without perforation or abscess without bleeding: Secondary | ICD-10-CM

## 2020-05-14 LAB — BASIC METABOLIC PANEL
Anion gap: 7 (ref 5–15)
BUN: 7 mg/dL (ref 6–20)
CO2: 25 mmol/L (ref 22–32)
Calcium: 7.9 mg/dL — ABNORMAL LOW (ref 8.9–10.3)
Chloride: 106 mmol/L (ref 98–111)
Creatinine, Ser: 0.75 mg/dL (ref 0.44–1.00)
GFR calc Af Amer: >60 mL/min (ref >60)
GFR calc non Af Amer: >60 mL/min (ref >60)
Glucose, Bld: 129 mg/dL — ABNORMAL HIGH (ref 70–99)
Potassium: 3.4 mmol/L — ABNORMAL LOW (ref 3.5–5.1)
Sodium: 138 mmol/L (ref 135–145)

## 2020-05-14 LAB — CBC
HCT: 30.7 % — ABNORMAL LOW (ref 36.0–46.0)
Hemoglobin: 10.1 g/dL — ABNORMAL LOW (ref 12.0–15.0)
MCH: 30.4 pg (ref 26.0–34.0)
MCHC: 32.9 g/dL (ref 30.0–36.0)
MCV: 92.5 fL (ref 80.0–100.0)
Platelets: 241 10*3/uL (ref 150–400)
RBC: 3.32 MIL/uL — ABNORMAL LOW (ref 3.87–5.11)
RDW: 12.1 % (ref 11.5–15.5)
WBC: 6.6 10*3/uL (ref 4.0–10.5)
nRBC: 0 % (ref 0.0–0.2)

## 2020-05-14 LAB — HIV ANTIBODY (ROUTINE TESTING W REFLEX): HIV Screen 4th Generation wRfx: NONREACTIVE

## 2020-05-14 MED ORDER — OXYBUTYNIN CHLORIDE 5 MG PO TABS: 5.0000 mg | ORAL_TABLET | Freq: Three times a day (TID) | ORAL | Status: DC | PRN

## 2020-05-14 MED ORDER — ACETAMINOPHEN 325 MG PO TABS
650.0000 mg | ORAL_TABLET | Freq: Four times a day (QID) | ORAL | Status: DC | PRN
  Administered 2020-05-14: 650 mg via ORAL
  Filled 2020-05-14: qty 2

## 2020-05-14 MED ORDER — BOOST / RESOURCE BREEZE PO LIQD CUSTOM
1.0000 | Freq: Three times a day (TID) | ORAL | Status: DC
  Administered 2020-05-14 (×2): 1 via ORAL

## 2020-05-14 MED ORDER — POTASSIUM CHLORIDE CRYS ER 20 MEQ PO TBCR
40.0000 meq | EXTENDED_RELEASE_TABLET | Freq: Once | ORAL | Status: AC
  Administered 2020-05-14: 40 meq via ORAL
  Filled 2020-05-14: qty 2

## 2020-05-14 NOTE — Progress Notes (Signed)
[facility] SURGICAL PROGRESS NOTE (cpt [phone])  Hospital Day(s): 1.   Interval History: Patient seen and examined, no acute events or new complaints overnight. Patient reports she is feeling the best she has felt in about 1 month. Abdominal pain significantly improved. No fever, chills, nausea, or emesis. No leukocytosis with WBC of 6.6K, BMP reassuring aside from very mild hypokalemia to 3.4. Has remained NPO since admission. No other complaints.   Review of Systems:  Constitutional: denies fever, chills  HEENT: denies cough or congestion  Respiratory: denies any shortness of breath  Cardiovascular: denies chest pain or palpitations  Gastrointestinal: denies abdominal pain, N/V, or diarrhea/and bowel function as per interval history Genitourinary: denies burning with urination or urinary frequency Musculoskeletal: denies pain, decreased motor or sensation  Vital signs in last 24 hours: [min-max] current  Temp:  [98 F (36.7 C)-99.3 F (37.4 C)] 98.3 F (36.8 C) (06/02 0410) Pulse Rate:  [81-89] 85 (06/02 0410) Resp:  [16-20] 20 (06/02 0410) BP: (100-136)/(49-92) 105/60 (06/02 0410) SpO2:  [95 %-99 %] 97 % (06/02 0410) Weight:  [76.7 kg] 76.7 kg (06/01 0839)     Height: 5\' 3"  (160 cm) Weight: 76.7 kg BMI (Calculated): 29.94   Intake/Output last 2 shifts:  06/01 0701 - 06/02 0700 In: 1666 [I.V.:1595; IV Piggyback:71.1] Out: -    Physical Exam:  Constitutional: alert, cooperative and no distress  HENT: normocephalic without obvious abnormality  Eyes: PERRL, EOM's grossly intact and symmetric  Respiratory: breathing non-labored at rest  Cardiovascular: regular rate and sinus rhythm  Gastrointestinal: soft, non-tender, and non-distended, no rebound/guarding Musculoskeletal: no edema or wounds, motor and sensation grossly intact, NT    Labs:  CBC Latest Ref Rng & Units 05/14/2020 05/13/2020 11/09/2015  WBC 4.0 - 10.5 K/uL 6.6 9.6 10.2  Hemoglobin 12.0 - 15.0 g/dL  10.1(L) 12.3 13.2  Hematocrit 36.0 - 46.0 % 30.7(L) 36.6 40.1  Platelets 150 - 400 K/uL 241 292 280   CMP Latest Ref Rng & Units 05/14/2020 05/13/2020 11/09/2015  Glucose 70 - 99 mg/dL 11/11/2015) 824(M) 353(I)  BUN 6 - 20 mg/dL 7 10 10   Creatinine 0.44 - 1.00 mg/dL 144(R 1.54  Sodium 135 - 145 mmol/L 138 139 140  Potassium 3.5 - 5.1 mmol/L 3.4(L) 3.4(L) 3.8  Chloride 98 - 111 mmol/L 106 103 103  CO2 22 - 32 mmol/L 25 27 30   Calcium 8.9 - 10.3 mg/dL 7.9(L) 8.8(L) 9.2  Total Protein 6.5 - 8.1 g/dL - 7.3 -  Total Bilirubin 0.3 - 1.2 mg/dL - 0.7 -  Alkaline Phos 38 - 126 U/L - 44 -  AST 15 - 41 U/L - 17 -  ALT 0 - 44 U/L - 15 -     Imaging studies: No new pertinent imaging studies   Assessment/Plan: (ICD-10's: K31.20) 54 y.o. female with clinically improving sigmoid diverticulitis with very small, non-drainable, abscess   - Initiate CLD this morning, advance to full liquids for dinner if doing well  - Continue IV ABx (Zosyn)  - Wean from IVF resuscitation once diet advancing  - Pain control prn; antiemetics prn  - monitor abdominal examination  - Replete K+; monitor   - No emergent surgical intervention; will benefit from elective sigmoid colectomy as outpatient once over this insult  - Further management per medicine service; we will follow  All of the above findings and recommendations were discussed with the patient, and the medical team, and all of patient's questions were answered to her expressed  satisfaction.  -- Edison Simon, PA-C [facility] 05/14/2020, 6:47 AM [phone] M-F: 7am - 4pm

## 2020-05-14 NOTE — Progress Notes (Signed)
PROGRESS NOTE    Heather Mcmahon  XTK:[id number] DOB: May 21, 1966 DOA: 05/13/2020 PCP: Patient, No Pcp Per  Brief Narrative:  HPI: Heather Mcmahon is a 54 y.o. female with medical history significant for hypertension and asthma who presents to the emergency room for evaluation of left lower quadrant pain which she has had for about a week.  She rates her pain an 8 by intensity at its worst.  Pain is nonradiating but has been persistent and associated with diarrhea.  She denies having any fever or chills, no nausea or vomiting.  She denies having any flank pain or urinary symptoms.  She has no cough, no shortness of breath or palpitations. She had a CT scan of abdomen pelvis done without contrast which showed sigmoid diverticulitis with a 1.6 x 1.6 x 1.4 cm peripherally enhancing fluid collection adjacent to the bowel compatible with a developing abscess. Small amount of free fluid within the anatomic pelvis is likely reactive. Cholelithiasis without cholecystitis.  6/2: Patient seen and examined.  Improving abdominal pain.  No clinical deterioration over interval.  Remains on IV antibiotics.  Seen by surgery.  Advance to clear liquid diet today.      Assessment & Plan:   Principal Problem:   Acute diverticulitis Active Problems:   Benign essential hypertension   Mild intermittent asthma   Hypokalemia  Acute diverticulitis With developing abscess (POA) Patient presents for evaluation of left lower quadrant pain which she has had for about a week and which has worsened. Imaging shows  left lower quadrant diverticulitis with a forming abscess - Seen by surgery.  No surgical intervention warranted at this time given clinical improvement Plan: Continue Zosyn Advance to clears per surgery Pain control as needed Serial abdominal exams   Hypokalemia Secondary to diuretic therapy Supplement potassium   Hypertension Continue HCTZ and losartan   Mild intermittent asthma Continue as  needed bronchodilator therapy as well as inhaled steroids and Singulair    DVT prophylaxis: Lovenox Code Status: Full Family Communication: Husband Gwyndolyn Saxon Pence via phone [phone] on 6-21 Disposition Plan: Status is: Inpatient  Remains inpatient appropriate because:Inpatient level of care appropriate due to severity of illness   Dispo: The patient is from: Home              Anticipated d/c is to: Home              Anticipated d/c date is: 1 day              Patient currently is not medically stable to d/c.  No surgical invention plan.  Remains on IV antibiotics.  Patient continues to clinically improve tentative plan to discharge on 05/15/2020   Consultants:   General surgery  Procedures:   None  Antimicrobials: Zosyn   Subjective: Patient seen and examined.  Reports improved abdominal pain.  Seen by surgery today.  Advance to clear liquid diet.  Objective: Vitals:   05/14/20 0410 05/14/20 0915 05/14/20 1031 05/14/20 1159  BP: 105/60 91/69 127/72 110/70  Pulse: 85 74 75 80  Resp: 20 20  16   Temp: 98.3 F (36.8 C) 98.3 F (36.8 C)  98.2 F (36.8 C)  TempSrc: Oral Oral  Oral  SpO2: 97% 97%  96%  Weight:      Height:        Intake/Output Summary (Last 24 hours) at 05/14/2020 1310 Last data filed at 05/14/2020 1051 Gross per 24 hour  Intake 1666.03 ml  Output 400 ml  Net  1266.03 ml   Filed Weights   05/13/20 0839  Weight: 76.7 kg    Examination:  General exam: Appears calm and comfortable  Respiratory system: Clear to auscultation. Respiratory effort normal. Cardiovascular system: S1 & S2 heard, RRR. No JVD, murmurs, rubs, gallops or clicks. No pedal edema. Gastrointestinal system: Abdomen is nondistended, soft and nontender. No organomegaly or masses felt. Normal bowel sounds heard. Central nervous system: Alert and oriented. No focal neurological deficits. Extremities: Symmetric 5 x 5 power. Skin: No rashes, lesions or ulcers Psychiatry: Judgement  and insight appear normal. Mood & affect appropriate.     Data Reviewed: I have personally reviewed following labs and imaging studies  CBC: Recent Labs  Lab 05/13/20 0842 05/14/20 0538  WBC 9.6 6.6  HGB 12.3 10.1*  HCT 36.6 30.7*  MCV 90.6 92.5  PLT 292 241   Basic Metabolic Panel: Recent Labs  Lab 05/13/20 0842 05/14/20 0538  NA 139 138  K 3.4* 3.4*  CL 103 106  CO2 27 25  GLUCOSE 110* 129*  BUN 10 7  CREATININE 0.76 0.75  CALCIUM 8.8* 7.9*   GFR: Estimated Creatinine Clearance: 79.7 mL/min (by C-G formula based on SCr of 0.75 mg/dL). Liver Function Tests: Recent Labs  Lab 05/13/20 0842  AST 17  ALT 15  ALKPHOS 44  BILITOT 0.7  PROT 7.3  ALBUMIN 3.9   Recent Labs  Lab 05/13/20 0842  LIPASE 28   No results for input(s): AMMONIA in the last 168 hours. Coagulation Profile: No results for input(s): INR, PROTIME in the last 168 hours. Cardiac Enzymes: No results for input(s): CKTOTAL, CKMB, CKMBINDEX, TROPONINI in the last 168 hours. BNP (last 3 results) No results for input(s): PROBNP in the last 8760 hours. HbA1C: No results for input(s): HGBA1C in the last 72 hours. CBG: No results for input(s): GLUCAP in the last 168 hours. Lipid Profile: No results for input(s): CHOL, HDL, LDLCALC, TRIG, CHOLHDL, LDLDIRECT in the last 72 hours. Thyroid Function Tests: No results for input(s): TSH, T4TOTAL, FREET4, T3FREE, THYROIDAB in the last 72 hours. Anemia Panel: No results for input(s): VITAMINB12, FOLATE, FERRITIN, TIBC, IRON, RETICCTPCT in the last 72 hours. Sepsis Labs: No results for input(s): PROCALCITON, LATICACIDVEN in the last 168 hours.  Recent Results (from the past 240 hour(s))  SARS Coronavirus 2 by RT PCR (hospital order, performed in [facility] hospital lab) Nasopharyngeal Nasopharyngeal Swab     Status: None   Collection Time: 05/13/20 12:11 PM   Specimen: Nasopharyngeal Swab  Result Value Ref Range Status   SARS Coronavirus 2 NEGATIVE  NEGATIVE Final    Comment: (NOTE) SARS-CoV-2 target nucleic acids are NOT DETECTED. The SARS-CoV-2 RNA is generally detectable in upper and lower respiratory specimens during the acute phase of infection. The lowest concentration of SARS-CoV-2 viral copies this assay can detect is 250 copies / mL. A negative result does not preclude SARS-CoV-2 infection and should not be used as the sole basis for treatment or other patient management decisions.  A negative result may occur with improper specimen collection / handling, submission of specimen other than nasopharyngeal swab, presence of viral mutation(s) within the areas targeted by this assay, and inadequate number of viral copies (<250 copies / mL). A negative result must be combined with clinical observations, patient history, and epidemiological information. Fact Sheet for Patients:   BoilerBrush.com.cy Fact Sheet for Healthcare Providers: https://pope.com/ This test is not yet approved or cleared  by the [place] FDA and has been authorized  for detection and/or diagnosis of SARS-CoV-2 by FDA under an Emergency Use Authorization (EUA).  This EUA will remain in effect (meaning this test can be used) for the duration of the COVID-19 declaration under Section 564(b)(1) of the Act, 21 U.S.C. section 360bbb-3(b)(1), unless the authorization is terminated or revoked sooner. Performed at [facility], [address] [place], [place] [address]   MRSA PCR Screening     Status: None   Collection Time: 05/13/20  6:43 PM   Specimen: Nasopharyngeal  Result Value Ref Range Status   MRSA by PCR NEGATIVE NEGATIVE Final    Comment:        The GeneXpert MRSA Assay (FDA approved for NASAL specimens only), is one component of a comprehensive MRSA colonization surveillance program. It is not intended to diagnose MRSA infection nor to guide or monitor treatment for MRSA  infections. Performed at [facility], [address] [place], [place] [address]          Radiology Studies: CT ABDOMEN PELVIS W CONTRAST  Result Date: 05/13/2020 CLINICAL DATA:  Left lower quadrant abdominal pain. Diverticulitis suspected. EXAM: CT ABDOMEN AND PELVIS WITH CONTRAST TECHNIQUE: Multidetector CT imaging of the abdomen and pelvis was performed using the standard protocol following bolus administration of intravenous contrast. CONTRAST:  OMNIPAQUE IOHEXOL 300 MG/ML  SOLN COMPARISON:  None. FINDINGS: Lower chest: The lung bases are clear without focal nodule, mass, or airspace disease. The heart size is normal. No significant pleural or pericardial effusions are present. Hepatobiliary: Liver is unremarkable. No focal lesions are present. Layering stones are present at the gallbladder neck. No inflammatory changes are present. The common bile duct is normal. Pancreas: Unremarkable. No pancreatic ductal dilatation or surrounding inflammatory changes. Spleen: Normal in size without focal abnormality. Adrenals/Urinary Tract: Adrenal glands are normal bilaterally. A simple cyst is present at the upper pole of the right kidney. Kidneys and ureters are otherwise within normal limits. No stone or mass lesion is present. No obstruction is present. The urinary bladder is within normal limits. Stomach/Bowel: The stomach and duodenum are within normal limits. Small bowel is unremarkable. Terminal ileum is within normal limits. The appendix is visualized and normal. The ascending transverse colon are normal. Mild diverticular changes are present in the distal descending colon. Marked inflammatory changes are present at the mid sigmoid colon with extensive diverticula. A peripherally enhancing fluid collection adjacent to the bowel measures 1.6 x 1.6 x 1.4 cm. No other collections or free air are present. Rectum is normal. Vascular/Lymphatic: Aorta and branch vessels are within normal  limits. Reproductive: Uterus and bilateral adnexa are unremarkable. Other: A small amount of free fluid is evident within the anatomic pelvis. No free air is present. Musculoskeletal: Vertebral body heights and alignment are normal. Bony pelvis is normal. Hips are located and within normal limits. IMPRESSION: 1. Sigmoid diverticulitis with a 1.6 x 1.6 x 1.4 cm peripherally enhancing fluid collection adjacent to the bowel compatible with a developing abscess. 2. Small amount of free fluid within the anatomic pelvis is likely reactive. 3. Cholelithiasis without cholecystitis. 4. Simple cyst at the upper pole of the right kidney. Electronically Signed   By: Marin Roberts M.D.   On: 05/13/2020 11:39        Scheduled Meds: . enoxaparin (LOVENOX) injection  40 mg Subcutaneous Q24H  . feeding supplement  1 Container Oral TID BM  . hydrochlorothiazide  12.5 mg Oral Daily  . losartan  50 mg Oral Daily  . mometasone-formoterol  2 puff Inhalation BID  . montelukast  10 mg Oral QHS  . multivitamin with minerals  1 tablet Oral Daily  . pantoprazole (PROTONIX) IV  40 mg Intravenous Q24H   Continuous Infusions: . dextrose 5 % and 0.45 % NaCl with KCl 20 mEq/L 100 mL/hr at 05/14/20 1037  . piperacillin-tazobactam (ZOSYN)  IV 12.5 mL/hr at 05/14/20 0646     LOS: 1 day    Time spent: 35 minutes    Tresa Moore, MD [facility] Pager [phone]  If 7PM-7AM, please contact night-coverage 05/14/2020, 1:10 PM

## 2020-05-14 NOTE — Progress Notes (Signed)
Initial Nutrition Assessment  DOCUMENTATION CODES:   Not applicable  INTERVENTION:  Boost Breeze po TID, each supplement provides 250 kcal and 9 grams of protein Continue MVI with minerals daily  Will provide vanilla Ensure Enlive with FLD advancement.     NUTRITION DIAGNOSIS:   Inadequate oral intake related to acute illness(acute diverticulitis) as evidenced by per patient/family report, percent weight loss, energy intake < or equal to 50% for > or equal to 5 days.    GOAL:   Patient will meet greater than or equal to 90% of their needs    MONITOR:   PO intake, Supplement acceptance, Labs, Weight trends, I & O's  REASON FOR ASSESSMENT:   Malnutrition Screening Tool    ASSESSMENT:  RD working remotely.  54 year old female admitted for acute diverticulitis presented with 1 week history of left lower quadrant pain associated with diarrhea. Past medical history of HTN and asthma.  Spoke with patient via phone this morning. She reports feeling much better today, has an appetite and tolerating CLD. She reports good home po, usual intake of 3 meals daily. Patient endorses recent poor po intake as well as  ~8 lb (4%) wt loss secondary to being sick over the past couple of weeks which is significant. Patient would benefit from nutrition supplement, amenable to drinking Boost Breeze and vanilla Ensure with diet advancement.   Per notes: - evaluated by surgery, noted no emergent surgical intervention -benefit from elective outpt sigmoid colectomy -FLD advance with dinner if pt continues to do well  Current wt 168.74 lb UBW 176-177 lb per pt report No recent wt history available for review, last weight prior to admission 167.64 lb on 10/31/18.  Medications reviewed and include: MVI, Protonix IVF: D5 and NaCl with KCl 20 mEq/L @ 100 ml/hr IVPB: Zosyn Labs: K 3.4 (L)  NUTRITION - FOCUSED PHYSICAL EXAM: Unable to complete at this time, RD working remotely.  Diet Order:    Diet Order            Diet full liquid Room service appropriate? Yes; Fluid consistency: Thin  Diet effective 1400        Diet clear liquid Room service appropriate? Yes; Fluid consistency: Thin  Diet effective now              EDUCATION NEEDS:   Education needs have been addressed  Skin:     Last BM:     Height:   Ht Readings from Last 1 Encounters:  05/13/20 5\' 3"  (1.6 m)    Weight:   Wt Readings from Last 1 Encounters:  05/13/20 76.7 kg    Ideal Body Weight:     BMI:  Body mass index is 29.94 kg/m.  Estimated Nutritional Needs:   Kcal:     Protein:     Fluid:      07/13/20, RD, LDN Clinical Nutrition After Hours/Weekend Pager # in Amion

## 2020-05-15 MED ORDER — AMOXICILLIN-POT CLAVULANATE 875-125 MG PO TABS
1.0000 | ORAL_TABLET | Freq: Two times a day (BID) | ORAL | 0 refills | Status: AC
Start: 2020-05-15 — End: 2020-05-27

## 2020-05-15 NOTE — Discharge Summary (Signed)
Physician Discharge Summary  JAYLAN DUGGAR ZOX:[id number] DOB: 01/23/1966 DOA: 05/13/2020  PCP: Patient, No Pcp Per  Admit date: 05/13/2020 Discharge date: 05/15/2020  Admitted From: Home Disposition: Home  Recommendations for Outpatient Follow-up:  1. Follow up with PCP in 1-2 weeks 2. Follow-up with general surgery in 2 to 3 weeks  Home Health: No Equipment/Devices: None  Discharge Condition: Stable CODE STATUS: Full Diet recommendation: Heart Healthy  Brief/Interim Summary: WJX:BJYNW C Ricksis a 54 y.o.femalewith medical history significant forhypertension and asthma who presents to the emergency room for evaluation of left lower quadrant pain which she has had for about a week. She rates her pain an 8 by intensity at its worst. Pain is nonradiating but has been persistent and associated with diarrhea.She denies having any fever or chills, no nausea or vomiting. She denies having any flank pain or urinary symptoms. She has no cough,no shortness of breath or palpitations. She had a CT scan of abdomen pelvis done without contrast whichshowed sigmoid diverticulitis with a 1.6 x 1.6 x 1.4 cm peripherally enhancing fluid collection adjacent to the bowel compatible with a developing abscess. Small amount of free fluid within the anatomic pelvis is likely reactive. Cholelithiasis without cholecystitis.  6/2: Patient seen and examined.  Improving abdominal pain.  No clinical deterioration over interval.  Remains on IV antibiotics.  Seen by surgery.  Advance to clear liquid diet today.  6/3: Seen and examined on the day of discharge.  No complaints, feels well.  Tolerating soft diet.  Stable for discharge home.  Prescribed additional 12 days of Augmentin to complete 14-day course.  Patient will follow up with Dr. Dahlia Byes from general surgery in 2 to 3 weeks.  Discharge Diagnoses:  Principal Problem:   Acute diverticulitis Active Problems:   Benign essential hypertension   Mild  intermittent asthma   Hypokalemia  Acute diverticulitis With developing abscess(POA) Patient presents for evaluation of left lower quadrant pain which she has had for about a week and which has worsened. Imaging shows left lower quadrant diverticulitis with a forming abscess - Seen by surgery.  No surgical intervention warranted at this time given clinical improvement Patient tolerating soft diet on day of discharge DC Zosyn and start Augmentin, complete 2-week course total Dietary recommendations provided in discharge Follow-up with general surgery in 2 to 3 weeks  Discharge Instructions  Discharge Instructions    Diet - low sodium heart healthy   Complete by: As directed    Increase activity slowly   Complete by: As directed      Allergies as of 05/15/2020   No Known Allergies     Medication List    TAKE these medications   albuterol 108 (90 Base) MCG/ACT inhaler Commonly known as: VENTOLIN HFA Inhale 2 puffs into the lungs every 6 (six) hours as needed for wheezing or shortness of breath.   amoxicillin-clavulanate 875-125 MG tablet Commonly known as: Augmentin Take 1 tablet by mouth 2 (two) times daily for 12 days.   losartan-hydrochlorothiazide 50-12.5 MG tablet Commonly known as: HYZAAR Take 1 tablet by mouth daily.   medroxyPROGESTERone 2.5 MG tablet Commonly known as: PROVERA Take by mouth.   montelukast 10 MG tablet Commonly known as: SINGULAIR Take by mouth.   Multi-Vitamins Tabs Take by mouth.   pantoprazole 40 MG tablet Commonly known as: PROTONIX Take by mouth.   Symbicort 160-4.5 MCG/ACT inhaler Generic drug: budesonide-formoterol Inhale 2 puffs into the lungs 2 (two) times daily.   Vitamin D (Ergocalciferol) 1.25 MG (  50000 UNIT) Caps capsule Commonly known as: DRISDOL TAKE ONE CAPSULE BY MOUTH ONCE A WEEK      Follow-up Information    Leafy Ro, MD. Go on 05/28/2020.   Specialty: General Surgery Why: 9:30am appointment Contact  information: [address] [place] [address] [phone]          No Known Allergies  Consultations:  General surgery   Procedures/Studies: CT ABDOMEN PELVIS W CONTRAST  Result Date: 05/13/2020 CLINICAL DATA:  Left lower quadrant abdominal pain. Diverticulitis suspected. EXAM: CT ABDOMEN AND PELVIS WITH CONTRAST TECHNIQUE: Multidetector CT imaging of the abdomen and pelvis was performed using the standard protocol following bolus administration of intravenous contrast. CONTRAST:  OMNIPAQUE IOHEXOL 300 MG/ML  SOLN COMPARISON:  None. FINDINGS: Lower chest: The lung bases are clear without focal nodule, mass, or airspace disease. The heart size is normal. No significant pleural or pericardial effusions are present. Hepatobiliary: Liver is unremarkable. No focal lesions are present. Layering stones are present at the gallbladder neck. No inflammatory changes are present. The common bile duct is normal. Pancreas: Unremarkable. No pancreatic ductal dilatation or surrounding inflammatory changes. Spleen: Normal in size without focal abnormality. Adrenals/Urinary Tract: Adrenal glands are normal bilaterally. A simple cyst is present at the upper pole of the right kidney. Kidneys and ureters are otherwise within normal limits. No stone or mass lesion is present. No obstruction is present. The urinary bladder is within normal limits. Stomach/Bowel: The stomach and duodenum are within normal limits. Small bowel is unremarkable. Terminal ileum is within normal limits. The appendix is visualized and normal. The ascending transverse colon are normal. Mild diverticular changes are present in the distal descending colon. Marked inflammatory changes are present at the mid sigmoid colon with extensive diverticula. A peripherally enhancing fluid collection adjacent to the bowel measures 1.6 x 1.6 x 1.4 cm. No other collections or free air are present. Rectum is normal. Vascular/Lymphatic:  Aorta and branch vessels are within normal limits. Reproductive: Uterus and bilateral adnexa are unremarkable. Other: A small amount of free fluid is evident within the anatomic pelvis. No free air is present. Musculoskeletal: Vertebral body heights and alignment are normal. Bony pelvis is normal. Hips are located and within normal limits. IMPRESSION: 1. Sigmoid diverticulitis with a 1.6 x 1.6 x 1.4 cm peripherally enhancing fluid collection adjacent to the bowel compatible with a developing abscess. 2. Small amount of free fluid within the anatomic pelvis is likely reactive. 3. Cholelithiasis without cholecystitis. 4. Simple cyst at the upper pole of the right kidney. Electronically Signed   By: Marin Roberts M.D.   On: 05/13/2020 11:39    (Echo, Carotid, EGD, Colonoscopy, ERCP)    Subjective: Patient seen and examined on the day of discharge.  No complaints, feels well.  Stable for discharge home.  Discharge Exam: Vitals:   05/15/20 0753 05/15/20 0856  BP: (!) 126/58   Pulse: 71   Resp: 16   Temp:  98.2 F (36.8 C)  SpO2: 97%    Vitals:   05/14/20 1952 05/15/20 0330 05/15/20 0753 05/15/20 0856  BP: 124/72 104/69 (!) 126/58   Pulse: 86 77 71   Resp: 16 16 16    Temp: 98.4 F (36.9 C) 98.3 F (36.8 C)  98.2 F (36.8 C)  TempSrc: Oral Oral  Oral  SpO2: 98% 97% 97%   Weight:      Height:        General: Pt is alert, awake, not in acute  distress Cardiovascular: RRR, S1/S2 +, no rubs, no gallops Respiratory: CTA bilaterally, no wheezing, no rhonchi Abdominal: Soft, NT, ND, bowel sounds + Extremities: no edema, no cyanosis    The results of significant diagnostics from this hospitalization (including imaging, microbiology, ancillary and laboratory) are listed below for reference.     Microbiology: Recent Results (from the past 240 hour(s))  SARS Coronavirus 2 by RT PCR (hospital order, performed in [facility] hospital lab) Nasopharyngeal Nasopharyngeal Swab      Status: None   Collection Time: 05/13/20 12:11 PM   Specimen: Nasopharyngeal Swab  Result Value Ref Range Status   SARS Coronavirus 2 NEGATIVE NEGATIVE Final    Comment: (NOTE) SARS-CoV-2 target nucleic acids are NOT DETECTED. The SARS-CoV-2 RNA is generally detectable in upper and lower respiratory specimens during the acute phase of infection. The lowest concentration of SARS-CoV-2 viral copies this assay can detect is 250 copies / mL. A negative result does not preclude SARS-CoV-2 infection and should not be used as the sole basis for treatment or other patient management decisions.  A negative result may occur with improper specimen collection / handling, submission of specimen other than nasopharyngeal swab, presence of viral mutation(s) within the areas targeted by this assay, and inadequate number of viral copies (<250 copies / mL). A negative result must be combined with clinical observations, patient history, and epidemiological information. Fact Sheet for Patients:   BoilerBrush.com.cy Fact Sheet for Healthcare Providers: https://pope.com/ This test is not yet approved or cleared  by the [place] FDA and has been authorized for detection and/or diagnosis of SARS-CoV-2 by FDA under an Emergency Use Authorization (EUA).  This EUA will remain in effect (meaning this test can be used) for the duration of the COVID-19 declaration under Section 564(b)(1) of the Act, 21 U.S.C. section 360bbb-3(b)(1), unless the authorization is terminated or revoked sooner. Performed at [facility], [address] [place], [place] [address]   MRSA PCR Screening     Status: None   Collection Time: 05/13/20  6:43 PM   Specimen: Nasopharyngeal  Result Value Ref Range Status   MRSA by PCR NEGATIVE NEGATIVE Final    Comment:        The GeneXpert MRSA Assay (FDA approved for NASAL specimens only), is one component of a comprehensive  MRSA colonization surveillance program. It is not intended to diagnose MRSA infection nor to guide or monitor treatment for MRSA infections. Performed at [facility], [address] [place], [place] [address]      Labs: BNP (last 3 results) No results for input(s): BNP in the last 8760 hours. Basic Metabolic Panel: Recent Labs  Lab 05/13/20 0842 05/14/20 0538  NA 139 138  K 3.4* 3.4*  CL 103 106  CO2 27 25  GLUCOSE 110* 129*  BUN 10 7  CREATININE 0.76 0.75  CALCIUM 8.8* 7.9*   Liver Function Tests: Recent Labs  Lab 05/13/20 0842  AST 17  ALT 15  ALKPHOS 44  BILITOT 0.7  PROT 7.3  ALBUMIN 3.9   Recent Labs  Lab 05/13/20 0842  LIPASE 28   No results for input(s): AMMONIA in the last 168 hours. CBC: Recent Labs  Lab 05/13/20 0842 05/14/20 0538  WBC 9.6 6.6  HGB 12.3 10.1*  HCT 36.6 30.7*  MCV 90.6 92.5  PLT 292 241   Cardiac Enzymes: No results for input(s): CKTOTAL, CKMB, CKMBINDEX, TROPONINI in the last 168 hours. BNP: Invalid input(s): POCBNP CBG: No results for input(s):  GLUCAP in the last 168 hours. D-Dimer No results for input(s): DDIMER in the last 72 hours. Hgb A1c No results for input(s): HGBA1C in the last 72 hours. Lipid Profile No results for input(s): CHOL, HDL, LDLCALC, TRIG, CHOLHDL, LDLDIRECT in the last 72 hours. Thyroid function studies No results for input(s): TSH, T4TOTAL, T3FREE, THYROIDAB in the last 72 hours.  Invalid input(s): FREET3 Anemia work up No results for input(s): VITAMINB12, FOLATE, FERRITIN, TIBC, IRON, RETICCTPCT in the last 72 hours. Urinalysis    Component Value Date/Time   COLORURINE AMBER (A) 05/13/2020 0843   APPEARANCEUR CLOUDY (A) 05/13/2020 0843   LABSPEC 1.026 05/13/2020 0843   PHURINE 6.0 05/13/2020 0843   GLUCOSEU NEGATIVE 05/13/2020 0843   HGBUR NEGATIVE 05/13/2020 0843   BILIRUBINUR NEGATIVE 05/13/2020 0843   KETONESUR NEGATIVE 05/13/2020 0843   PROTEINUR NEGATIVE 05/13/2020  0843   NITRITE NEGATIVE 05/13/2020 0843   LEUKOCYTESUR SMALL (A) 05/13/2020 0843   Sepsis Labs Invalid input(s): PROCALCITONIN,  WBC,  LACTICIDVEN Microbiology Recent Results (from the past 240 hour(s))  SARS Coronavirus 2 by RT PCR (hospital order, performed in [facility] hospital lab) Nasopharyngeal Nasopharyngeal Swab     Status: None   Collection Time: 05/13/20 12:11 PM   Specimen: Nasopharyngeal Swab  Result Value Ref Range Status   SARS Coronavirus 2 NEGATIVE NEGATIVE Final    Comment: (NOTE) SARS-CoV-2 target nucleic acids are NOT DETECTED. The SARS-CoV-2 RNA is generally detectable in upper and lower respiratory specimens during the acute phase of infection. The lowest concentration of SARS-CoV-2 viral copies this assay can detect is 250 copies / mL. A negative result does not preclude SARS-CoV-2 infection and should not be used as the sole basis for treatment or other patient management decisions.  A negative result may occur with improper specimen collection / handling, submission of specimen other than nasopharyngeal swab, presence of viral mutation(s) within the areas targeted by this assay, and inadequate number of viral copies (<250 copies / mL). A negative result must be combined with clinical observations, patient history, and epidemiological information. Fact Sheet for Patients:   BoilerBrush.com.cy Fact Sheet for Healthcare Providers: https://pope.com/ This test is not yet approved or cleared  by the [place] FDA and has been authorized for detection and/or diagnosis of SARS-CoV-2 by FDA under an Emergency Use Authorization (EUA).  This EUA will remain in effect (meaning this test can be used) for the duration of the COVID-19 declaration under Section 564(b)(1) of the Act, 21 U.S.C. section 360bbb-3(b)(1), unless the authorization is terminated or revoked sooner. Performed at [facility], [address] [place], [place] [address]   MRSA PCR Screening     Status: None   Collection Time: 05/13/20  6:43 PM   Specimen: Nasopharyngeal  Result Value Ref Range Status   MRSA by PCR NEGATIVE NEGATIVE Final    Comment:        The GeneXpert MRSA Assay (FDA approved for NASAL specimens only), is one component of a comprehensive MRSA colonization surveillance program. It is not intended to diagnose MRSA infection nor to guide or monitor treatment for MRSA infections. Performed at [facility], [address], [place], [place] [address]      Time coordinating discharge: Over 30 minutes  SIGNED:   Tresa Moore, MD  [facility] 05/15/2020, 11:57 AM Pager   If 7PM-7AM, please contact night-coverage

## 2020-05-15 NOTE — Progress Notes (Signed)
Heather Mcmahon A and O x4. VSS. Pt tolerating diet well. No complaints of nausea or vomiting. IV removed intact, prescriptions given. Pt voices understanding of discharge instructions with no further questions. Patient discharged via wheelchair with volunteer  Allergies as of 05/15/2020   No Known Allergies     Medication List    TAKE these medications   albuterol 108 (90 Base) MCG/ACT inhaler Commonly known as: VENTOLIN HFA Inhale 2 puffs into the lungs every 6 (six) hours as needed for wheezing or shortness of breath.   amoxicillin-clavulanate 875-125 MG tablet Commonly known as: Augmentin Take 1 tablet by mouth 2 (two) times daily for 12 days.   losartan-hydrochlorothiazide 50-12.5 MG tablet Commonly known as: HYZAAR Take 1 tablet by mouth daily.   medroxyPROGESTERone 2.5 MG tablet Commonly known as: PROVERA Take by mouth.   montelukast 10 MG tablet Commonly known as: SINGULAIR Take by mouth.   Multi-Vitamins Tabs Take by mouth.   pantoprazole 40 MG tablet Commonly known as: PROTONIX Take by mouth.   Symbicort 160-4.5 MCG/ACT inhaler Generic drug: budesonide-formoterol Inhale 2 puffs into the lungs 2 (two) times daily.   Vitamin D (Ergocalciferol) 1.25 MG (50000 UNIT) Caps capsule Commonly known as: DRISDOL TAKE ONE CAPSULE BY MOUTH ONCE A WEEK       Vitals:   05/15/20 0753 05/15/20 0856  BP: (!) 126/58   Pulse: 71   Resp: 16   Temp:  98.2 F (36.8 Heather)  SpO2: 97%     Heather Mcmahon Heather Mcmahon

## 2020-05-15 NOTE — Discharge Instructions (Signed)

## 2020-05-15 NOTE — Progress Notes (Signed)
[facility] SURGICAL PROGRESS NOTE (cpt [id number])  Hospital Day(s): 2.   Interval History: Patient seen and examined, no acute events or new complaints overnight. Patient reports she continues to feel great, denies abdominal pain, distension, nausea, emesis, fever, or chills. No new labs or imaging this morning. Tolerated full liquid diet without issues. Having bowel function. Mobilizing without issues.   Review of Systems:  Constitutional: denies fever, chills  HEENT: denies cough or congestion  Respiratory: denies any shortness of breath  Cardiovascular: denies chest pain or palpitations  Gastrointestinal: denies abdominal pain, N/V, or diarrhea/and bowel function as per interval history Genitourinary: denies burning with urination or urinary frequency Musculoskeletal: denies pain, decreased motor or sensation  Vital signs in last 24 hours: [min-max] current  Temp:  [98.2 F (36.8 C)-98.4 F (36.9 C)] 98.3 F (36.8 C) (06/03 0330) Pulse Rate:  [74-86] 77 (06/03 0330) Resp:  [16-20] 16 (06/03 0330) BP: (91-127)/(69-72) 104/69 (06/03 0330) SpO2:  [96 %-100 %] 97 % (06/03 0330)     Height: 5\' 3"  (160 cm) Weight: 76.7 kg BMI (Calculated): 29.94   Intake/Output last 2 shifts:  06/02 0701 - 06/03 0700 In: 480 [P.O.:480] Out: 3600 [Urine:3600]   Physical Exam:  Constitutional: alert, cooperative and no distress  HENT: normocephalic without obvious abnormality  Eyes: PERRL, EOM's grossly intact and symmetric  Respiratory: breathing non-labored at rest  Cardiovascular: regular rate and sinus rhythm  Gastrointestinal: soft, non-tender, and non-distended, no rebound/guarding Musculoskeletal: no edema or wounds, motor and sensation grossly intact, NT    Labs:  CBC Latest Ref Rng & Units 05/14/2020 05/13/2020 11/09/2015  WBC 4.0 - 10.5 K/uL 6.6 9.6 10.2  Hemoglobin 12.0 - 15.0 g/dL 10.1(L) 12.3 13.2  Hematocrit 36.0 - 46.0 % 30.7(L) 36.6 40.1  Platelets 150 - 400 K/uL 241  292 280   CMP Latest Ref Rng & Units 05/14/2020 05/13/2020 11/09/2015  Glucose 70 - 99 mg/dL 11/11/2015) 191(Y) 782(N)  BUN 6 - 20 mg/dL 7 10 10   Creatinine 0.44 - 1.00 mg/dL 562(Z 3.08  Sodium 135 - 145 mmol/L 138 139 140  Potassium 3.5 - 5.1 mmol/L 3.4(L) 3.4(L) 3.8  Chloride 98 - 111 mmol/L 106 103 103  CO2 22 - 32 mmol/L 25 27 30   Calcium 8.9 - 10.3 mg/dL 7.9(L) 8.8(L) 9.2  Total Protein 6.5 - 8.1 g/dL - 7.3 -  Total Bilirubin 0.3 - 1.2 mg/dL - 0.7 -  Alkaline Phos 38 - 126 U/L - 44 -  AST 15 - 41 U/L - 17 -  ALT 0 - 44 U/L - 15 -     Imaging studies: No new pertinent imaging studies   Assessment/Plan: (ICD-10's: K30.20) 54 y.o. female with clinically improving sigmoid diverticulitis with very small, non-drainable, abscess   - Advance to soft diet             - Continue IV ABx (Zosyn); transition to PO at discharge             - Discontinue IVF              - Pain control prn; antiemetics prn             - monitor abdominal examination             - No emergent surgical intervention; will benefit from elective sigmoid colectomy as outpatient once over this insult             - Further management per medicine service;  we will follow   - Discharge Planning; If patient tolerating soft diet today then she is okay for discharge from surgery standpoint; will need to complete 14 days ABx total (Augmentin); follow up with Dr Dahlia Byes in 2-3 weeks  All of the above findings and recommendations were discussed with the patient, and the medical team, and all of patient's questions were answered to her expressed satisfaction.  -- Edison Simon, PA-C [facility] 05/15/2020, 6:59 AM [phone] M-F: 7am - 4pm

## 2020-05-28 ENCOUNTER — Inpatient Hospital Stay: Payer: Managed Care, Other (non HMO) | Admitting: Surgery

## 2020-05-28 ENCOUNTER — Other Ambulatory Visit: Payer: Self-pay

## 2020-05-28 ENCOUNTER — Ambulatory Visit: Payer: Managed Care, Other (non HMO) | Admitting: Physician Assistant

## 2020-05-28 ENCOUNTER — Encounter: Payer: Self-pay | Admitting: Surgery

## 2020-05-28 VITALS — BP 141/84 | HR 68 | Temp 97.7°F | Ht 63.0 in | Wt 165.0 lb

## 2020-05-28 DIAGNOSIS — K5792 Diverticulitis of intestine, part unspecified, without perforation or abscess without bleeding: Secondary | ICD-10-CM | POA: Diagnosis not present

## 2020-05-28 NOTE — Progress Notes (Signed)
[facility] SURGICAL CLINIC NOTE  05/28/2020  History of Present Illness: Heather Mcmahon is a 54 y.o. female known to our service following an admission to [facility] on 06/01-06/03 for complicated diverticulitis with small abscess. She did well and has completed her course of Augmentin. She presents for follow up  Today, she reports that she feels much better. No complaints of abdominal pain, fever, chills, nausea, emesis, or diarrhea. She does report somewhat of a decreased appetite and feeling more fatigued since her admission, but this is slowly improving. She is trying to increase her dietary fiber and stay hydrated but this is challenging for her. No other new issues or complaints. Again, she has a history of similar in 2016 and underwent colonoscopy at that time with Dr Markham Jordan. I am unfortunately unable to view this report, however, she is due for colonoscopy this year.    Past Medical History: Past Medical History:  Diagnosis Date  . Benign essential hypertension 01/08/2016  . Cough      Past Surgical History: Past Surgical History:  Procedure Laterality Date  . BREAST BIOPSY Right 01/11/2007  . ECTOPIC PREGNANCY SURGERY  1999  . OOPHORECTOMY     right ovary due to cyst    Home Medications: Prior to Admission medications   Medication Sig Start Date End Date Taking? Authorizing Provider  albuterol (PROVENTIL HFA;VENTOLIN HFA) 108 (90 Base) MCG/ACT inhaler Inhale 2 puffs into the lungs every 6 (six) hours as needed for wheezing or shortness of breath. 10/31/18  Yes Collene Gobble, MD  losartan-hydrochlorothiazide (HYZAAR) 50-12.5 MG tablet Take 1 tablet by mouth daily. 04/26/20  Yes [provider]  Multiple Vitamin (MULTI-VITAMINS) TABS Take by mouth.   Yes [provider]  SYMBICORT 160-4.5 MCG/ACT inhaler Inhale 2 puffs into the lungs 2 (two) times daily. 10/31/18  Yes Collene Gobble, MD  Vitamin D, Ergocalciferol, (DRISDOL) 1.25 MG (50000 UT) CAPS  capsule TAKE ONE CAPSULE BY MOUTH ONCE A WEEK 01/05/18  Yes [provider]  medroxyPROGESTERone (PROVERA) 2.5 MG tablet Take by mouth. 12/15/17 05/13/20  [provider]  montelukast (SINGULAIR) 10 MG tablet Take by mouth. 11/20/15 05/13/20  [provider]  pantoprazole (PROTONIX) 40 MG tablet Take by mouth. 07/28/17 05/13/20  [provider]    Allergies: No Known Allergies  Review of Systems: Review of Systems  Constitutional: Positive for malaise/fatigue. Negative for chills and fever.  HENT: Negative for congestion and sore throat.   Cardiovascular: Negative for chest pain and palpitations.  Gastrointestinal: Negative for abdominal pain, blood in stool, constipation, diarrhea, nausea and vomiting.  Genitourinary: Negative for dysuria and urgency.  All other systems reviewed and are negative.   Physical Exam BP (!) 141/84   Pulse 68   Temp 97.7 F (36.5 C)   Ht 5\' 3"  (1.6 m)   Wt 165 lb (74.8 kg)   SpO2 100%   BMI 29.23 kg/m   Physical Exam Vitals and nursing note reviewed. Exam conducted with a chaperone present.  Constitutional:      General: She is not in acute distress.    Appearance: Normal appearance. She is normal weight. She is not ill-appearing.  HENT:     Head: Normocephalic and atraumatic.  Eyes:     Conjunctiva/sclera: Conjunctivae normal.     Pupils: Pupils are equal, round, and reactive to light.  Cardiovascular:     Rate and Rhythm: Normal rate and regular rhythm.     Pulses: Normal pulses.  Heart sounds: No murmur heard.   Pulmonary:     Effort: Pulmonary effort is normal. No respiratory distress.     Breath sounds: Normal breath sounds.  Abdominal:     General: Abdomen is flat. There is no distension.     Tenderness: There is no abdominal tenderness. There is no guarding or rebound.     Hernia: No hernia is present.  Genitourinary:    Comments: Deferred Musculoskeletal:        General: Normal range of motion.      Right lower leg: No edema.     Left lower leg: No edema.  Skin:    General: Skin is warm and dry.     Coloration: Skin is not pale.     Findings: No erythema.  Neurological:     General: No focal deficit present.     Mental Status: She is alert and oriented to person, place, and time.  Psychiatric:        Mood and Affect: Mood normal.        Behavior: Behavior normal.     Labs/Imaging: No new pertinent labs or imaging related to this visit.    Assessment and Plan: This is a 54 y.o. female with a history of recurrent diverticulitis, most recently admitted with an episode of complicated diverticulitis on 06/01   - No further need for ABx, she has completed her 2 week course  - I do think it is pertinent to proceed with colonoscopy in ~6-8 weeks. Her previous GI physician has retired, and I gave her the option of referral back to [facility] gastroenterology vs [facility]. At this time she wishes to see [facility]. Referral given  - I also spent some time discussing role for elective sigmoid colectomy in this setting given recurrence and most recent episode of complicated diverticulitis. She understands this and would like to defer this for now  - Nothing further at this time  - We will follow up with her once colonoscopy is completed.   Face-to-face time spent with the patient and care providers was 25 minutes, with more than 50% of the time spent counseling, educating, and coordinating care of the patient.     Edison Simon, PA-C [facility] 05/28/2020, 10:28 AM [phone] M-F: 7am - 4pm

## 2020-05-28 NOTE — Patient Instructions (Addendum)
Keep increasing your fluids. May start moving towards a high fiber diet.  We will refer you to [facility] to get your next colonoscopy. They will contact you to schedule this.   Follow-up with our office as needed.  Please call and ask to speak with a nurse if you develop questions or concerns.    Diverticulitis  Diverticulitis is when small pockets in your large intestine (colon) get infected or swollen. This causes stomach pain and watery poop (diarrhea). These pouches are called diverticula. They form in people who have a condition called diverticulosis. Follow these instructions at home: Medicines  Take over-the-counter and prescription medicines only as told by your doctor. These include: ? Antibiotics. ? Pain medicines. ? Fiber pills. ? Probiotics. ? Stool softeners.  Do not drive or use heavy machinery while taking prescription pain medicine.  If you were prescribed an antibiotic, take it as told. Do not stop taking it even if you feel better. General instructions   Follow a diet as told by your doctor.  When you feel better, your doctor may tell you to change your diet. You may need to eat a lot of fiber. Fiber makes it easier to poop (have bowel movements). Healthy foods with fiber include: ? Berries. ? Beans. ? Lentils. ? Green vegetables.  Exercise 3 or more times a week. Aim for 30 minutes each time. Exercise enough to sweat and make your heart beat faster.  Keep all follow-up visits as told. This is important. You may need to have an exam of the large intestine. This is called a colonoscopy. Contact a doctor if:  Your pain does not get better.  You have a hard time eating or drinking.  You are not pooping like normal. Get help right away if:  Your pain gets worse.  Your problems do not get better.  Your problems get worse very fast.  You have a fever.  You throw up (vomit) more than one time.  You have poop that  is: ? Bloody. ? Black. ? Tarry. Summary  Diverticulitis is when small pockets in your large intestine (colon) get infected or swollen.  Take medicines only as told by your doctor.  Follow a diet as told by your doctor. This information is not intended to replace advice given to you by your health care provider. Make sure you discuss any questions you have with your health care provider. Document Revised: 11/11/2017 Document Reviewed: 12/16/2016 Elsevier Patient Education  2020 [employer].

## 2020-06-06 ENCOUNTER — Encounter: Payer: Self-pay | Admitting: *Deleted

## 2020-08-04 ENCOUNTER — Other Ambulatory Visit: Payer: Self-pay | Admitting: Internal Medicine

## 2020-08-04 DIAGNOSIS — R928 Other abnormal and inconclusive findings on diagnostic imaging of breast: Secondary | ICD-10-CM

## 2020-08-07 ENCOUNTER — Ambulatory Visit (LOCAL_COMMUNITY_HEALTH_CENTER): Payer: Managed Care, Other (non HMO)

## 2020-08-07 ENCOUNTER — Other Ambulatory Visit: Payer: Self-pay

## 2020-08-07 DIAGNOSIS — Z7189 Other specified counseling: Secondary | ICD-10-CM

## 2020-08-07 DIAGNOSIS — Z7185 Encounter for immunization safety counseling: Secondary | ICD-10-CM

## 2020-08-07 NOTE — Progress Notes (Signed)
Pt requesting Tetanus  booster. Pt counseled that last Tdap was 03/06/11 and Td is every 10 years, would repeat after 5 years PRN after an injury.  Pt denies any injury/wound and not concerned about risks for injury/wound, does not want to proceed with booster today. Provided copy of [facility] to pt.

## 2021-02-04 ENCOUNTER — Other Ambulatory Visit: Payer: Self-pay | Admitting: Internal Medicine

## 2021-02-04 DIAGNOSIS — N6320 Unspecified lump in the left breast, unspecified quadrant: Secondary | ICD-10-CM

## 2021-02-04 DIAGNOSIS — R928 Other abnormal and inconclusive findings on diagnostic imaging of breast: Secondary | ICD-10-CM

## 2021-05-06 ENCOUNTER — Other Ambulatory Visit: Payer: Self-pay

## 2021-05-06 ENCOUNTER — Ambulatory Visit (LOCAL_COMMUNITY_HEALTH_CENTER): Payer: Managed Care, Other (non HMO)

## 2021-05-06 DIAGNOSIS — Z23 Encounter for immunization: Secondary | ICD-10-CM | POA: Diagnosis not present

## 2021-05-06 NOTE — Progress Notes (Signed)
In Nurse Clinic for Tdap. Tolerated vaccine well. Updated [facility] copy given and explained. Jerel Shepherd, RN

## 2021-06-22 ENCOUNTER — Other Ambulatory Visit: Payer: Self-pay

## 2021-06-22 ENCOUNTER — Emergency Department
Admission: EM | Admit: 2021-06-22 | Discharge: 2021-06-23 | Disposition: A | Discharge status: Home / Self Care | Payer: Managed Care, Other (non HMO) | Attending: Emergency Medicine | Admitting: Emergency Medicine

## 2021-06-22 DIAGNOSIS — I10 Essential (primary) hypertension: Secondary | ICD-10-CM | POA: Insufficient documentation

## 2021-06-22 DIAGNOSIS — Z79899 Other long term (current) drug therapy: Secondary | ICD-10-CM | POA: Insufficient documentation

## 2021-06-22 DIAGNOSIS — R1032 Left lower quadrant pain: Secondary | ICD-10-CM | POA: Diagnosis present

## 2021-06-22 DIAGNOSIS — J4520 Mild intermittent asthma, uncomplicated: Secondary | ICD-10-CM | POA: Diagnosis not present

## 2021-06-22 DIAGNOSIS — E876 Hypokalemia: Secondary | ICD-10-CM | POA: Diagnosis not present

## 2021-06-22 DIAGNOSIS — K5732 Diverticulitis of large intestine without perforation or abscess without bleeding: Secondary | ICD-10-CM | POA: Diagnosis not present

## 2021-06-22 LAB — LIPASE, BLOOD: Lipase: 31 U/L (ref 11–51)

## 2021-06-22 LAB — CBC
HCT: 36.9 % (ref 36.0–46.0)
Hemoglobin: 12.3 g/dL (ref 12.0–15.0)
MCH: 30.5 pg (ref 26.0–34.0)
MCHC: 33.3 g/dL (ref 30.0–36.0)
MCV: 91.6 fL (ref 80.0–100.0)
Platelets: 292 10*3/uL (ref 150–400)
RBC: 4.03 MIL/uL (ref 3.87–5.11)
RDW: 12.1 % (ref 11.5–15.5)
WBC: 10.5 10*3/uL (ref 4.0–10.5)
nRBC: 0 % (ref 0.0–0.2)

## 2021-06-22 LAB — COMPREHENSIVE METABOLIC PANEL
ALT: 17 U/L (ref 0–44)
AST: 20 U/L (ref 15–41)
Albumin: 4.1 g/dL (ref 3.5–5.0)
Alkaline Phosphatase: 55 U/L (ref 38–126)
Anion gap: 8 (ref 5–15)
BUN: 10 mg/dL (ref 6–20)
CO2: 27 mmol/L (ref 22–32)
Calcium: 9.3 mg/dL (ref 8.9–10.3)
Chloride: 103 mmol/L (ref 98–111)
Creatinine, Ser: 0.75 mg/dL (ref 0.44–1.00)
GFR, Estimated: >60 mL/min (ref >60)
Glucose, Bld: 105 mg/dL — ABNORMAL HIGH (ref 70–99)
Potassium: 3.2 mmol/L — ABNORMAL LOW (ref 3.5–5.1)
Sodium: 138 mmol/L (ref 135–145)
Total Bilirubin: 1.1 mg/dL (ref 0.3–1.2)
Total Protein: 7.7 g/dL (ref 6.5–8.1)

## 2021-06-22 NOTE — ED Triage Notes (Addendum)
Pt states she has hx of abscessed diverticula on left side, pt was seen at walk in clinic on the 29th and given rx for Augmentin, pt states pain went away but has come back stronger today on lower left side. Pt denies n/v. States she has diarrhea. Pt has XR of abd on 29th of june

## 2021-06-22 NOTE — ED Notes (Addendum)
Per MD Jessup no imaging at this time, waiting for blood work.

## 2021-06-22 NOTE — ED Notes (Signed)
No answer when called several times from lobby, no answer when phone # listed in chart called 

## 2021-06-23 ENCOUNTER — Emergency Department: Payer: Managed Care, Other (non HMO)

## 2021-06-23 LAB — URINALYSIS, COMPLETE (UACMP) WITH MICROSCOPIC
Bilirubin Urine: NEGATIVE
Glucose, UA: NEGATIVE mg/dL
Ketones, ur: NEGATIVE mg/dL
Nitrite: NEGATIVE
Protein, ur: NEGATIVE mg/dL
Specific Gravity, Urine: 1.024 (ref 1.005–1.030)
pH: 5 (ref 5.0–8.0)

## 2021-06-23 LAB — POC URINE PREG, ED: Preg Test, Ur: NEGATIVE

## 2021-06-23 MED ORDER — OXYCODONE HCL 5 MG PO TABS: 5.0000 mg | ORAL_TABLET | Freq: Four times a day (QID) | ORAL | 0 refills | Status: AC | PRN

## 2021-06-23 MED ORDER — IOHEXOL 350 MG/ML SOLN
100.0000 mL | Freq: Once | INTRAVENOUS | Status: AC | PRN
  Administered 2021-06-23: 100 mL via INTRAVENOUS

## 2021-06-23 MED ORDER — POTASSIUM CHLORIDE CRYS ER 20 MEQ PO TBCR
40.0000 meq | EXTENDED_RELEASE_TABLET | Freq: Once | ORAL | Status: AC
  Administered 2021-06-23: 40 meq via ORAL
  Filled 2021-06-23: qty 2

## 2021-06-23 MED ORDER — ONDANSETRON 4 MG PO TBDP
4.0000 mg | ORAL_TABLET | Freq: Three times a day (TID) | ORAL | 0 refills | Status: AC | PRN
Start: 2021-06-23 — End: ?

## 2021-06-23 NOTE — Discharge Instructions (Addendum)
Use Tylenol for pain and fevers.  Up to 1000 mg per dose, up to 4 times per day.  Do not take more than 4000 mg of Tylenol/acetaminophen within 24 hours..  Use naproxen/Aleve for anti-inflammatory pain relief. Use up to 500mg every 12 hours. Do not take more frequently than this. Do not use other NSAIDs (ibuprofen, Advil) while taking this medication. It is safe to take Tylenol with this.   

## 2021-06-23 NOTE — ED Provider Notes (Signed)
[facility] Emergency Department Provider Note ____________________________________________   Event Date/Time   First MD Initiated Contact with Patient 06/23/21 0020     (approximate)  I have reviewed the triage vital signs and the nursing notes.  HISTORY  Chief Complaint Abdominal Pain   HPI Heather Mcmahon is a 55 y.o. femalewho presents to the ED for evaluation of abdominal pain.  Chart review indicates HTN and obesity. Admission about 1 year ago for sigmoid diverticulitis complicated by a small 1.5 cm abscess.  Treated medically with antibiotics without surgical intervention.   Patient presents to the ED for evaluation of LLQ abdominal pain, diarrhea, concern for recurrence of diverticulitis.  She reports her symptoms started about 2 weeks ago, and she went to the local walk-in clinic and got a course of Augmentin, which she finished.  She reports some transient improvement of her symptoms alongside this, but after finishing her course of antibiotics few days ago, she has had re-worsening of the pain.  She reports associated diarrhea and loose stools without hematochezia or melena.  Denies dysuria, increased urinary frequency or any urinary changes.  Denies fevers or systemic symptoms, emesis, chest pain, vaginal discharge or bleeding.  Past Medical History:  Diagnosis Date   Benign essential hypertension 01/08/2016   Cough     Patient Active Problem List   Diagnosis Date Noted   Acute diverticulitis 05/13/2020   Hypokalemia 05/13/2020   DUB (dysfunctional uterine bleeding) 10/31/2018   Benign essential hypertension 01/08/2016   Low serum vitamin D 01/08/2016   Mild intermittent asthma 01/08/2016   ANXIETY 09/06/2007    Past Surgical History:  Procedure Laterality Date   BREAST BIOPSY Right 01/11/2007   COLONOSCOPY  07/29/2015   ECTOPIC PREGNANCY SURGERY  1999   OOPHORECTOMY     right ovary due to cyst    Prior to Admission medications    Medication Sig Start Date End Date Taking? Authorizing Provider  ondansetron (ZOFRAN ODT) 4 MG disintegrating tablet Take 1 tablet (4 mg total) by mouth every 8 (eight) hours as needed for nausea or vomiting. 06/23/21  Yes Delton Prairie, MD  oxyCODONE (ROXICODONE) 5 MG immediate release tablet Take 1 tablet (5 mg total) by mouth every 6 (six) hours as needed for breakthrough pain or severe pain. 06/23/21 06/23/22 Yes Delton Prairie, MD  albuterol (PROVENTIL HFA;VENTOLIN HFA) 108 (90 Base) MCG/ACT inhaler Inhale 2 puffs into the lungs every 6 (six) hours as needed for wheezing or shortness of breath. 10/31/18   Collene Gobble, MD  losartan-hydrochlorothiazide (HYZAAR) 50-12.5 MG tablet Take 1 tablet by mouth daily. 04/26/20   [provider]  medroxyPROGESTERone (PROVERA) 2.5 MG tablet Take by mouth. 12/15/17 05/13/20  [provider]  montelukast (SINGULAIR) 10 MG tablet Take by mouth. 11/20/15 05/13/20  [provider]  Multiple Vitamin (MULTI-VITAMINS) TABS Take by mouth.    [provider]  pantoprazole (PROTONIX) 40 MG tablet Take by mouth. 07/28/17 05/13/20  [provider]  SYMBICORT 160-4.5 MCG/ACT inhaler Inhale 2 puffs into the lungs 2 (two) times daily. 10/31/18   Collene Gobble, MD  Vitamin D, Ergocalciferol, (DRISDOL) 1.25 MG (50000 UT) CAPS capsule TAKE ONE CAPSULE BY MOUTH ONCE A WEEK 01/05/18   [provider]    Allergies Patient has no known allergies.  Family History  Problem Relation Age of Onset   Irritable bowel syndrome Mother    COPD Mother    Heart attack Father    Irritable bowel  syndrome Maternal Grandmother    Breast cancer Neg Hx     Social History Social History   Tobacco Use   Smoking status: Never   Smokeless tobacco: Never  Vaping Use   Vaping Use: Never used  Substance Use Topics   Alcohol use: Yes   Drug use: No    Review of Systems  Constitutional: No fever/chills Eyes: No visual changes. ENT: No sore  throat. Cardiovascular: Denies chest pain. Respiratory: Denies shortness of breath. Gastrointestinal:   No nausea, no vomiting. No constipation. Positive for abdominal pain and diarrhea. Genitourinary: Negative for dysuria. Musculoskeletal: Negative for back pain. Skin: Negative for rash. Neurological: Negative for headaches, focal weakness or numbness. ____________________________________________   PHYSICAL EXAM:  VITAL SIGNS: Vitals:   06/23/21 0211 06/23/21 0419  BP: 103/64 129/76  Pulse: 77 87  Resp: 16 16  Temp:    SpO2: 100% 95%    Constitutional: Alert and oriented. Well appearing and in no acute distress. Eyes: Conjunctivae are normal. PERRL. EOMI. Head: Atraumatic. Nose: No congestion/rhinnorhea. Mouth/Throat: Mucous membranes are moist.  Oropharynx non-erythematous. Neck: No stridor. No cervical spine tenderness to palpation. Cardiovascular: Normal rate, regular rhythm. Grossly normal heart sounds.  Good peripheral circulation. Respiratory: Normal respiratory effort.  No retractions. Lungs CTAB. Gastrointestinal: Soft , nondistended. No CVA tenderness. LLQ tenderness without peritoneal features.  Otherwise benign abdomen. Musculoskeletal: No lower extremity tenderness nor edema.  No joint effusions. No signs of acute trauma. Neurologic:  Normal speech and language. No gross focal neurologic deficits are appreciated. No gait instability noted. Skin:  Skin is warm, dry and intact. No rash noted. Psychiatric: Mood and affect are normal. Speech and behavior are normal. ____________________________________________   LABS (all labs ordered are listed, but only abnormal results are displayed)  Labs Reviewed  COMPREHENSIVE METABOLIC PANEL - Abnormal; Notable for the following components:      Result Value   Potassium 3.2 (*)    Glucose, Bld 105 (*)    All other components within normal limits  URINALYSIS, COMPLETE (UACMP) WITH MICROSCOPIC - Abnormal; Notable for the  following components:   Color, Urine YELLOW (*)    APPearance CLOUDY (*)    Hgb urine dipstick SMALL (*)    Leukocytes,Ua LARGE (*)    Bacteria, UA RARE (*)    All other components within normal limits  URINE CULTURE  LIPASE, BLOOD  CBC  POC URINE PREG, ED   ____________________________________________  12 Lead EKG   ____________________________________________  RADIOLOGY  ED MD interpretation: CT reviewed by me with evidence of diverticulitis without associated abscess or signs of perforation  Official radiology report(s): CT ABDOMEN PELVIS W CONTRAST  Result Date: 06/23/2021 CLINICAL DATA:  Left lower quadrant pain, history of diverticular abscess, evaluate for recurrence. EXAM: CT ABDOMEN AND PELVIS WITH CONTRAST TECHNIQUE: Multidetector CT imaging of the abdomen and pelvis was performed using the standard protocol following bolus administration of intravenous contrast. CONTRAST:  100mL OMNIPAQUE IOHEXOL 350 MG/ML SOLN COMPARISON:  CT 05/13/2020 FINDINGS: Lower chest: Lung bases are clear. Normal heart size. No pericardial effusion. Hepatobiliary: No worrisome focal liver lesions. Smooth liver surface contour. Normal hepatic attenuation. Minimal focal thickening of the gallbladder fundus may reflect adenomyomatosis. No conspicuous gallbladder inflammation or features of acute cholecystitis. No visible calcified gallstones or biliary ductal dilatation. Pancreas: No pancreatic ductal dilatation or surrounding inflammatory changes. Spleen: Normal in size. No concerning splenic lesions. Adrenals/Urinary Tract: Normal adrenal glands. Kidneys are normally located with symmetric enhancement and excretion. Subcentimeter hypoattenuating focus  in the upper pole right kidney too small to fully characterize on CT imaging but statistically likely benign. No suspicious renal lesion, urolithiasis or hydronephrosis. Urinary bladder is unremarkable for the degree of distention. Stomach/Bowel: Distal  esophagus, stomach and duodenal sweep are unremarkable. No small bowel wall thickening or dilatation. Normal appendix in the right lower quadrant. The diffuse pancolonic diverticulosis. There are a pair of separate segments of circumferentially thickened colon centered upon a culprit diverticula seen in the descending colon (2/38) and in the proximal to mid sigmoid, the latter in a region similar to comparison prior imaging albeit without recurrent or residual diverticular abscess seen at this time (5/37). No resulting obstruction. Vascular/Lymphatic: No significant vascular findings are present. No enlarged abdominal or pelvic lymph nodes. Reproductive: Anteverted uterus. No concerning adnexal mass. The left ovary is in notable proximity to the previously inflamed segment of sigmoid. Other: No abdominopelvic free fluid or free gas. Inflammation centered upon the descending and sigmoid colon, as described above.No bowel containing hernias. Musculoskeletal: No acute osseous abnormality or suspicious osseous lesion. Minimal degenerative changes in the spine, hips and pelvis. IMPRESSION: Evidence of acute uncomplicated diverticulitis affecting 2 separate segments of distal colon, the first within the mid descending and the second within the proximal to mid sigmoid colon. The latter is in a similar vicinity due to comparison prior imaging albeit without significant residual diverticular abscess seen at this time. Focal mural thickening at the gallbladder fundus without adjacent inflammatory change, can reflect some underlying adenomyomatosis. Could consider outpatient right upper quadrant ultrasound, as clinically warranted. These results were called by telephone at the time of interpretation on 06/23/2021 at 3:56 am to provider [facility] , who verbally acknowledged these results. Electronically Signed   By: Kreg Shropshire M.D.   On: 06/23/2021 03:57    ____________________________________________   PROCEDURES and  INTERVENTIONS  Procedure(s) performed (including Critical Care):  .1-3 Lead EKG Interpretation  Date/Time: 06/23/2021 5:01 AM Performed by: Delton Prairie, MD Authorized by: Delton Prairie, MD     Interpretation: normal     ECG rate:  84   ECG rate assessment: normal     Rhythm: sinus rhythm     Ectopy: none     Conduction: normal    Medications  potassium chloride SA (KLOR-CON) CR tablet 40 mEq (40 mEq Oral Given 06/23/21 0211)  iohexol (OMNIPAQUE) 350 MG/ML injection 100 mL (100 mLs Intravenous Contrast Given 06/23/21 0338)    ____________________________________________   MDM / ED COURSE   55 year old woman presents to the ED with LLQ pain consistent with symptomatic diverticulitis, amenable to outpatient management.  Normal vitals.  Exam generally reassuring with mild LLQ tenderness without peritoneal features, and she otherwise looks well.  Blood work with mild hypokalemia that was repleted orally, otherwise unremarkable.  No leukocytosis noted.  Urinalysis with moderate leukocytes and red cells, the patient has no urinary symptoms or changes no antibiotics were withheld and this was sent for a culture.  CT obtained due to patient's history of complicated diverticulitis with abscess in the past, and CT today is reassuring without evidence of perforation or abscess.  Her pain is well controlled, remained stable, and she is tolerating p.o. intake.  I see no barriers to outpatient management.  We will provide medications for symptom control and discharged with return precautions.  Clinical Course as of 06/23/21 0500  Tue Jun 23, 2021  0457 Reassessed.  Patient reports feeling well.  She has tolerated p.o. intake without complications.  We discussed  CT findings of acute uncomplicated diverticulitis.  We discussed outpatient management with symptom control and no further antibiotics.  We discussed return precautions for the ED. [DS]    Clinical Course User Index [DS] Delton Prairie, MD     ____________________________________________   FINAL CLINICAL IMPRESSION(S) / ED DIAGNOSES  Final diagnoses:  Diverticulitis large intestine w/o perforation or abscess w/o bleeding     ED Discharge Orders          Ordered    oxyCODONE (ROXICODONE) 5 MG immediate release tablet  Every 6 hours PRN        06/23/21 0458    ondansetron (ZOFRAN ODT) 4 MG disintegrating tablet  Every 8 hours PRN        06/23/21 0459             Mykia Holton   Note:  This document was prepared using Dragon voice recognition software and may include unintentional dictation errors.    Delton Prairie, MD 06/23/21 [phone]

## 2021-06-24 LAB — URINE CULTURE: Culture: 30000 — AB

## 2022-03-15 ENCOUNTER — Other Ambulatory Visit: Payer: Self-pay | Admitting: Internal Medicine

## 2022-03-15 DIAGNOSIS — Z1231 Encounter for screening mammogram for malignant neoplasm of breast: Secondary | ICD-10-CM

## 2022-03-23 ENCOUNTER — Other Ambulatory Visit: Payer: Self-pay | Admitting: Internal Medicine

## 2022-03-23 DIAGNOSIS — N63 Unspecified lump in unspecified breast: Secondary | ICD-10-CM

## 2022-03-23 DIAGNOSIS — Z1231 Encounter for screening mammogram for malignant neoplasm of breast: Secondary | ICD-10-CM

## 2022-04-12 ENCOUNTER — Ambulatory Visit
Admission: RE | Admit: 2022-04-12 | Discharge: 2022-04-12 | Disposition: A | Discharge status: Home / Self Care | Payer: Managed Care, Other (non HMO) | Source: Ambulatory Visit | Attending: Internal Medicine | Admitting: Internal Medicine

## 2022-04-12 DIAGNOSIS — Z1231 Encounter for screening mammogram for malignant neoplasm of breast: Secondary | ICD-10-CM | POA: Insufficient documentation

## 2022-04-12 DIAGNOSIS — N63 Unspecified lump in unspecified breast: Secondary | ICD-10-CM | POA: Insufficient documentation

## 2022-09-07 IMAGING — MG DIGITAL DIAGNOSTIC BILAT W/ TOMO W/ CAD
8 series · 8 of 24 positions shown · non-contrast
Comparison: Prior films

CLINICAL DATA: Short-term follow-up left breast mass from 4227.

EXAM:
DIGITAL DIAGNOSTIC BILATERAL MAMMOGRAM WITH TOMOSYNTHESIS AND CAD;
ULTRASOUND LEFT BREAST LIMITED
TECHNIQUE: Bilateral digital diagnostic mammography and breast tomosynthesis
was performed. The images were evaluated with computer-aided
detection.; Targeted ultrasound examination of the left breast was
performed.

[R MLO synth-2D]
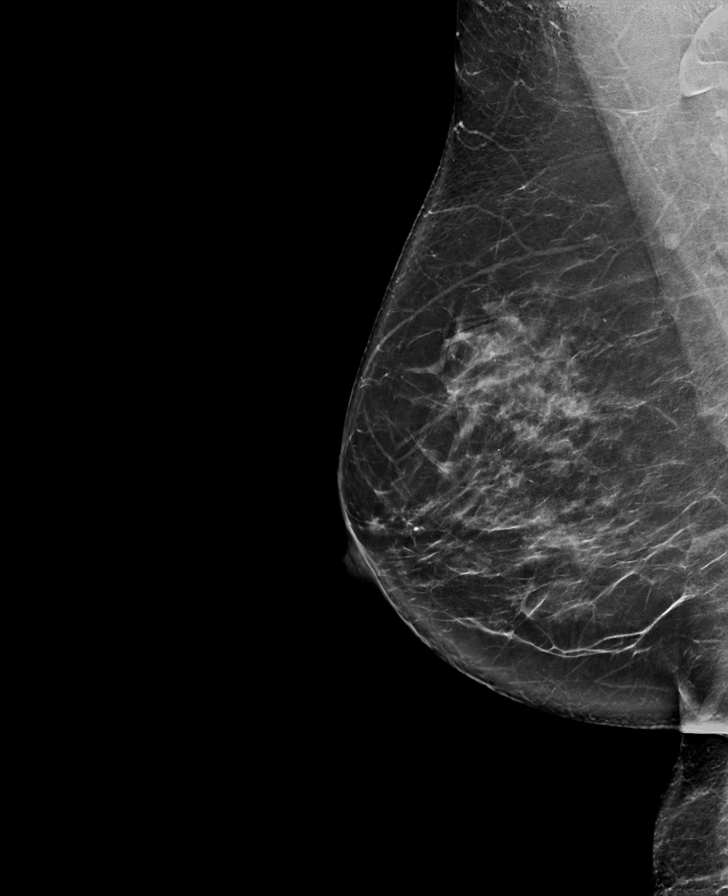

[L MLO synth-2D]
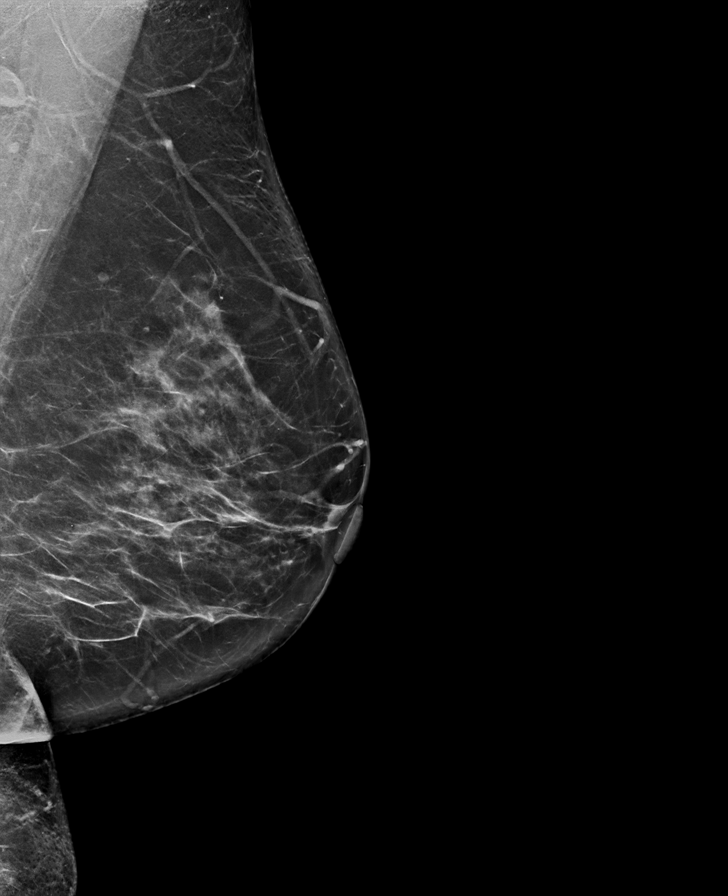

[R CC synth-2D]
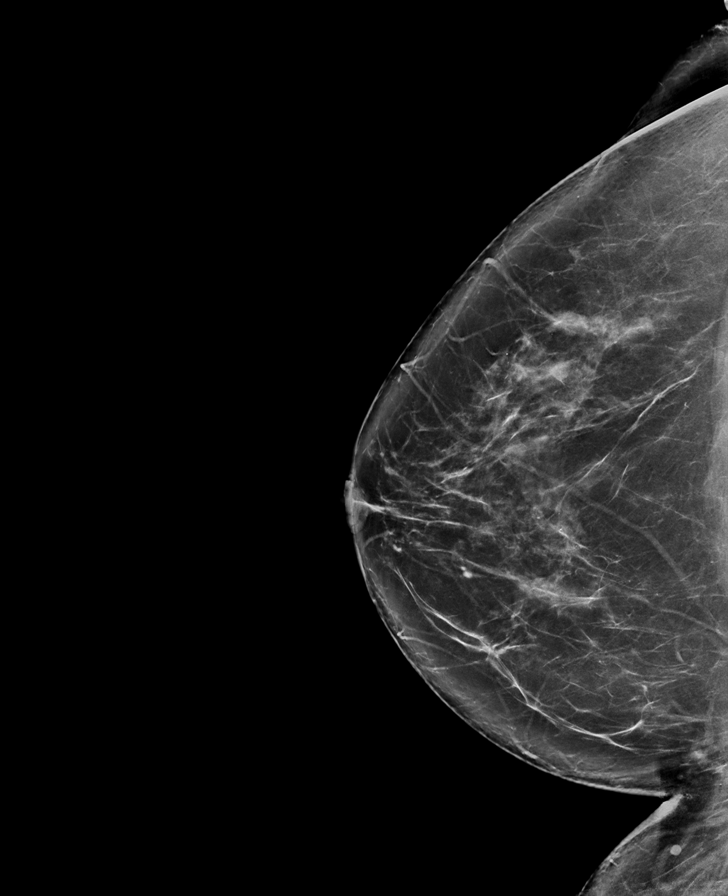

[L CC synth-2D]
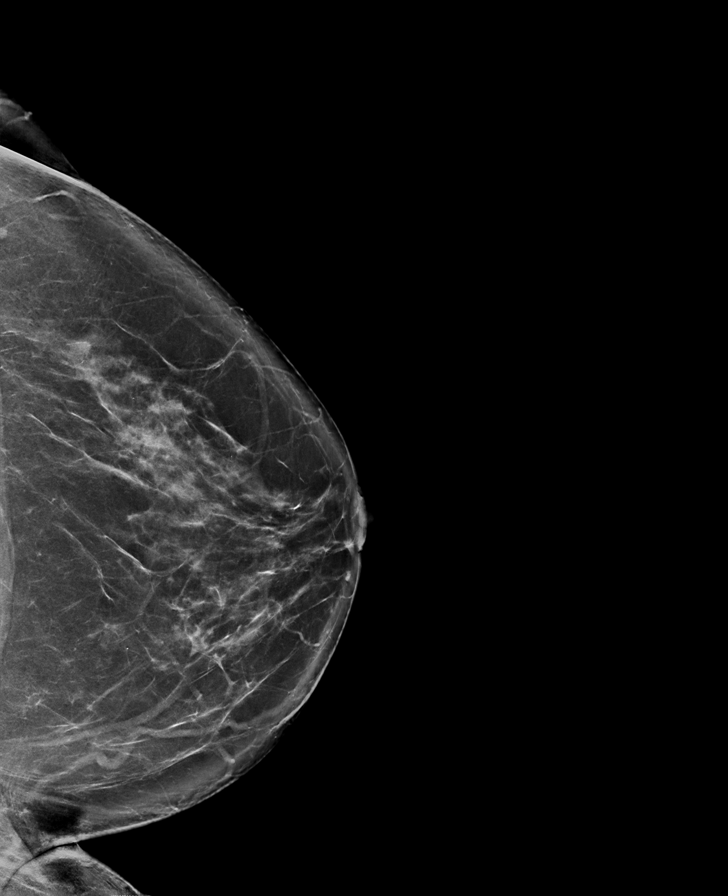

[R CC tomo · tomo slice 45/90.0]
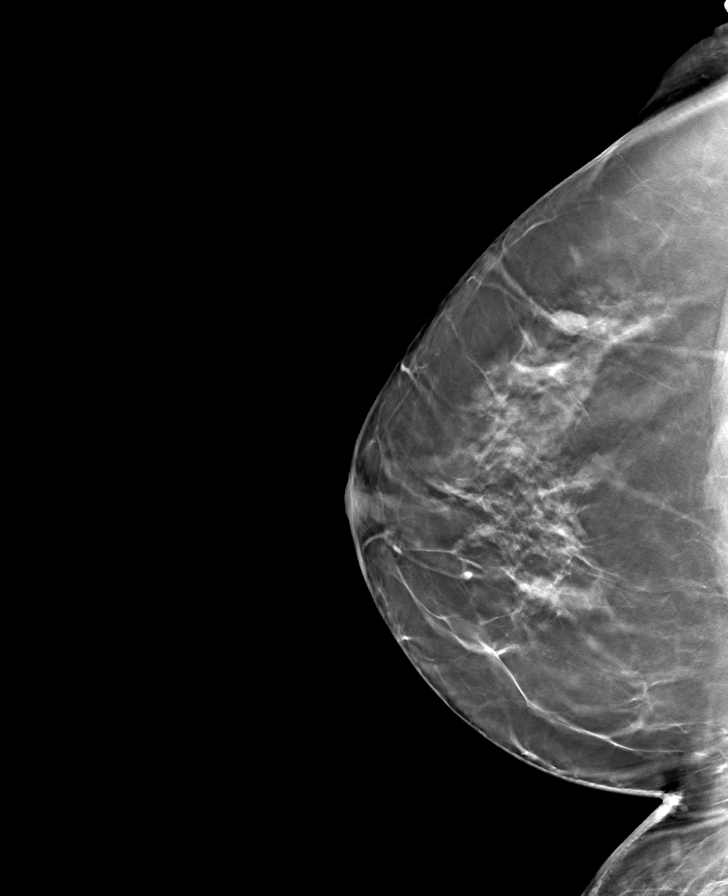

[R MLO tomo · tomo slice 45/89.0]
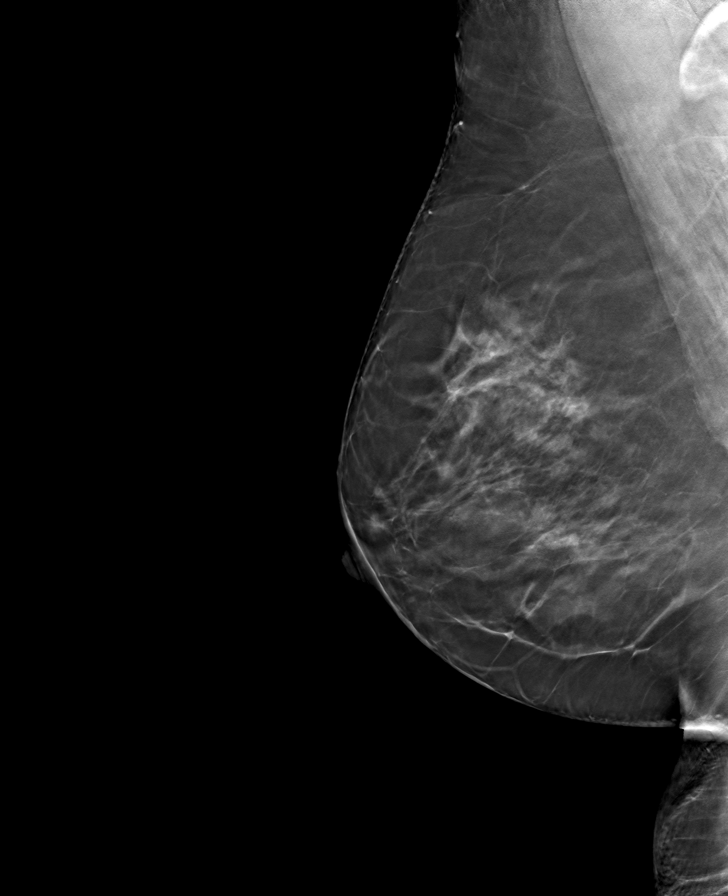

[L CC tomo · tomo slice 44/87.0]
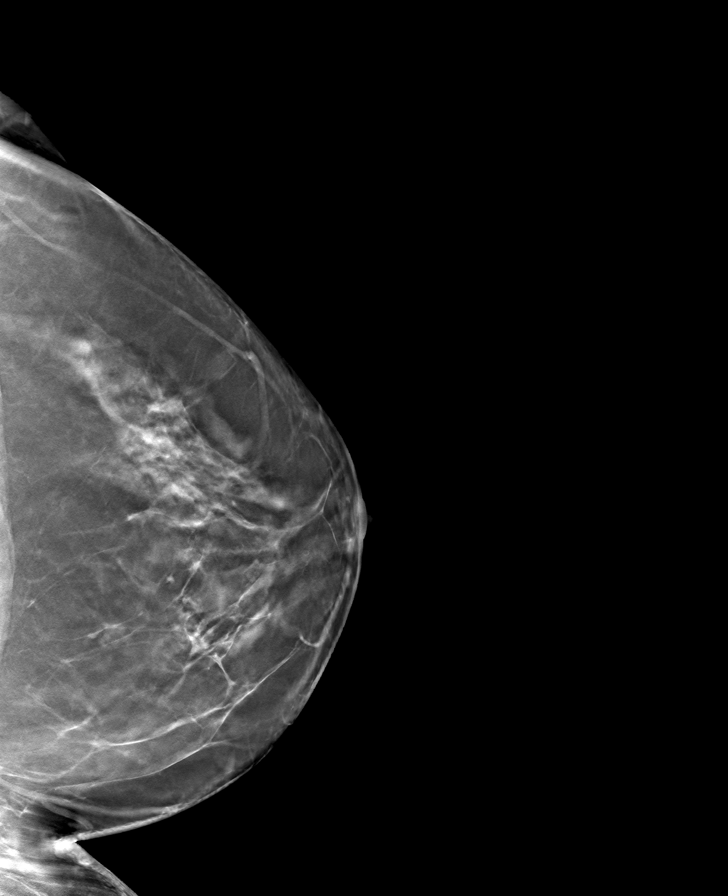

[L MLO tomo · tomo slice 42/83.0]
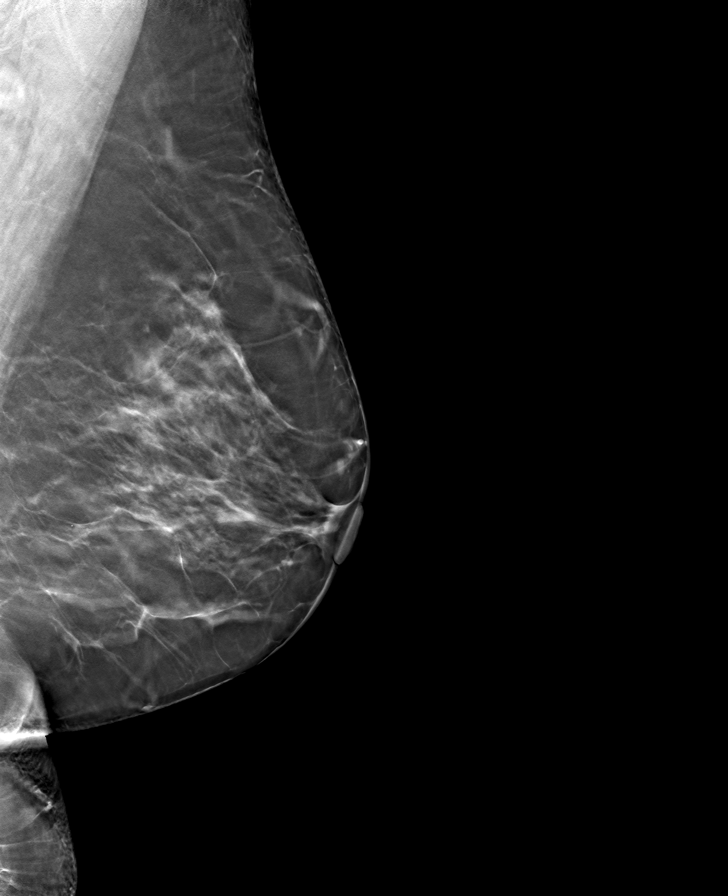

[8 of 24 positions shown; findings below may reference images not displayed]

ACR Breast Density Category b: There are scattered areas of
fibroglandular density.
FINDINGS: Cc and MLO views of bilateral breasts are submitted. No suspicious
identified bilaterally. The breast parenchyma is stable bilaterally.

Targeted ultrasound is performed, showing stable 6 x 3 x 6 mm oval
hypoechoic mass at left breast 2 o'clock 7 cm from nipple unchanged
compared to prior ultrasound.
IMPRESSION: Benign findings.

RECOMMENDATION:
Routine screening in one year.

I have discussed the findings and recommendations with the patient.
If applicable, a reminder letter will be sent to the patient
regarding the next appointment.

BI-RADS CATEGORY  2: Benign.

## 2022-09-07 IMAGING — US US BREAST*L* LIMITED INC AXILLA
1 series · 5 of 5 positions shown · non-contrast
Comparison: Prior films

CLINICAL DATA: Short-term follow-up left breast mass from 4227.

EXAM:
DIGITAL DIAGNOSTIC BILATERAL MAMMOGRAM WITH TOMOSYNTHESIS AND CAD;
ULTRASOUND LEFT BREAST LIMITED
TECHNIQUE: Bilateral digital diagnostic mammography and breast tomosynthesis
was performed. The images were evaluated with computer-aided
detection.; Targeted ultrasound examination of the left breast was
performed.

[Series 1: us breast*left* limited inc axilla · 0.06mm/px · 5 of 5 slices shown]
[im 1/5]
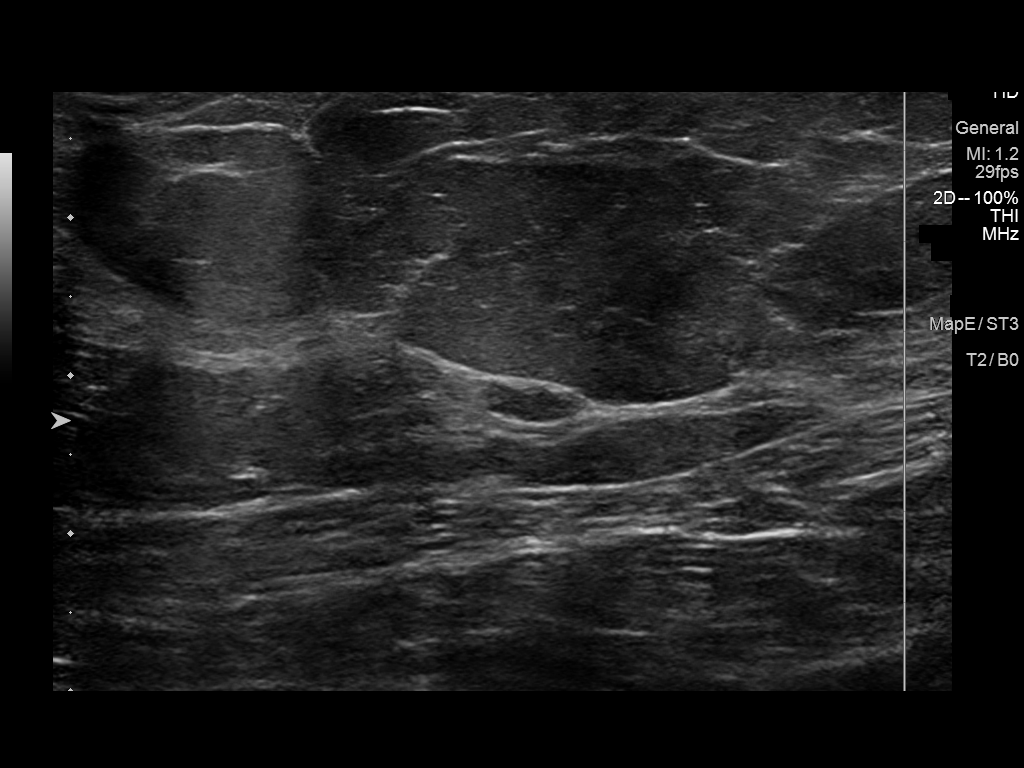
[im 2/5]
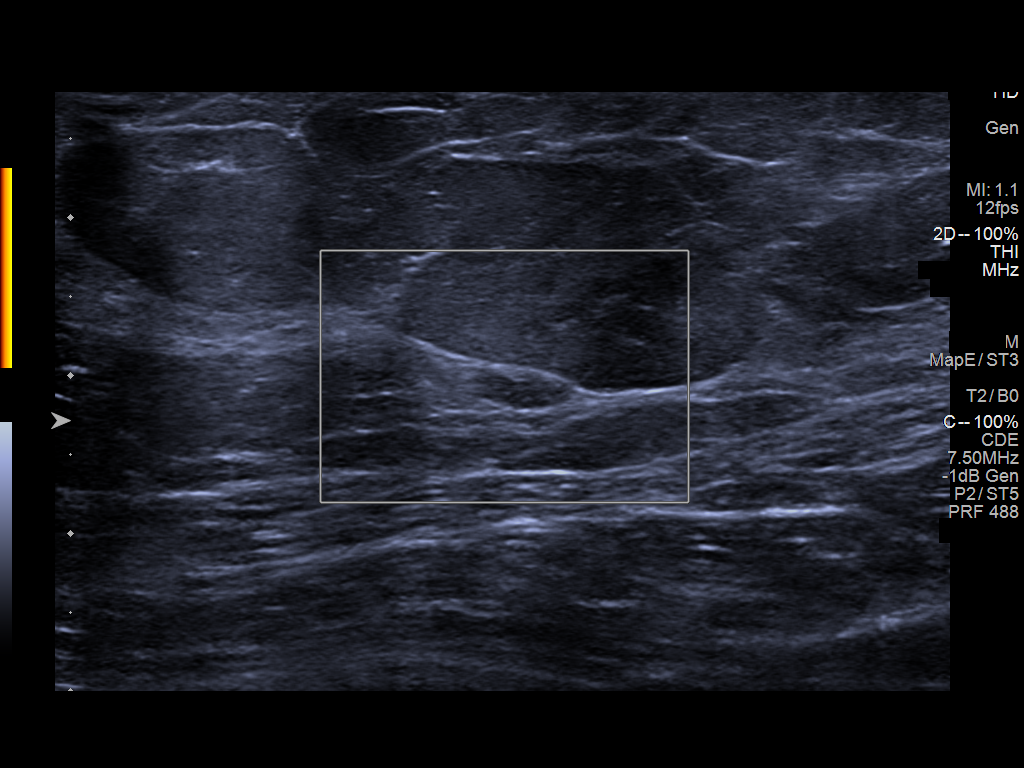
[im 3/5]
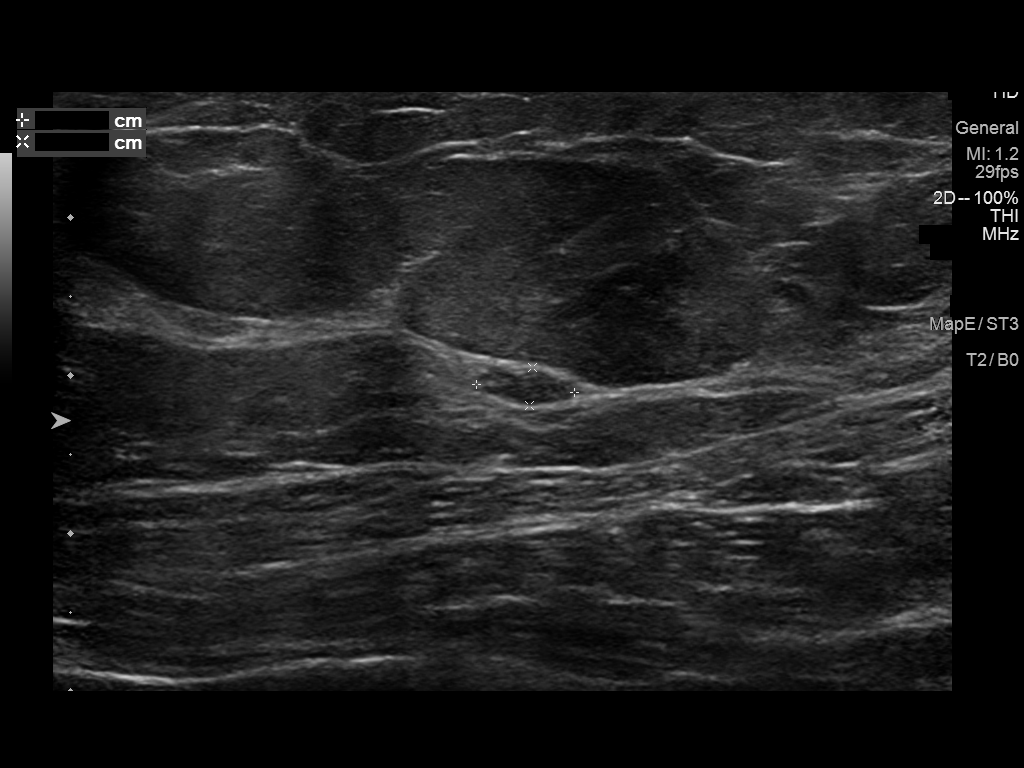
[im 4/5]
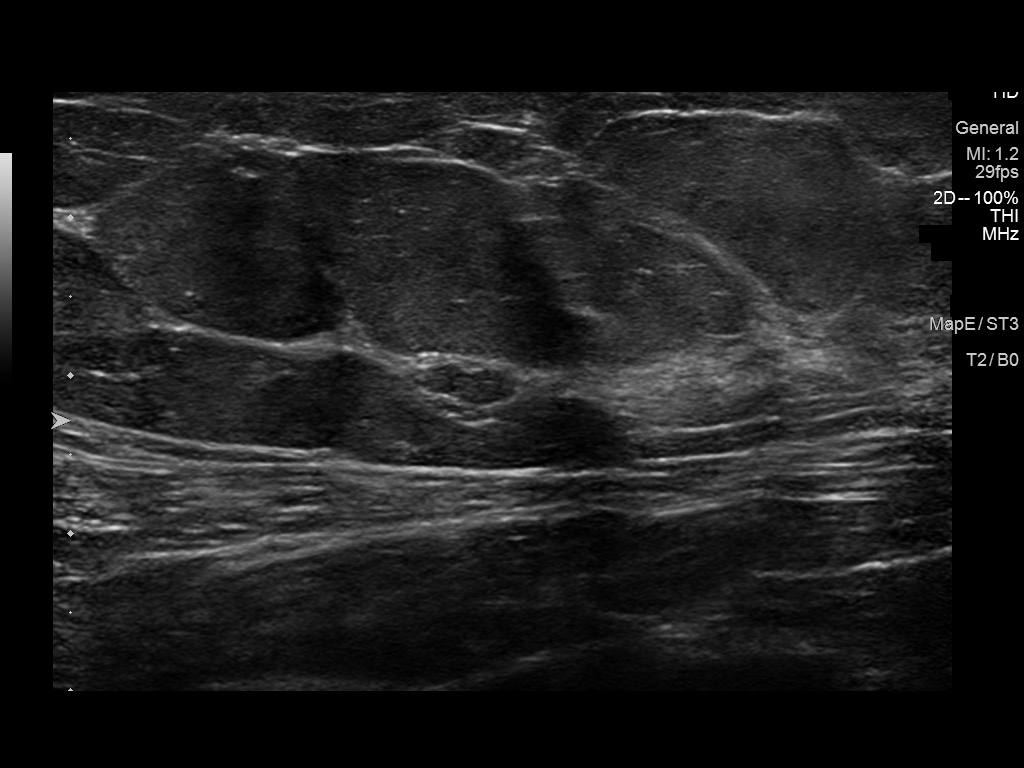
[im 5/5]
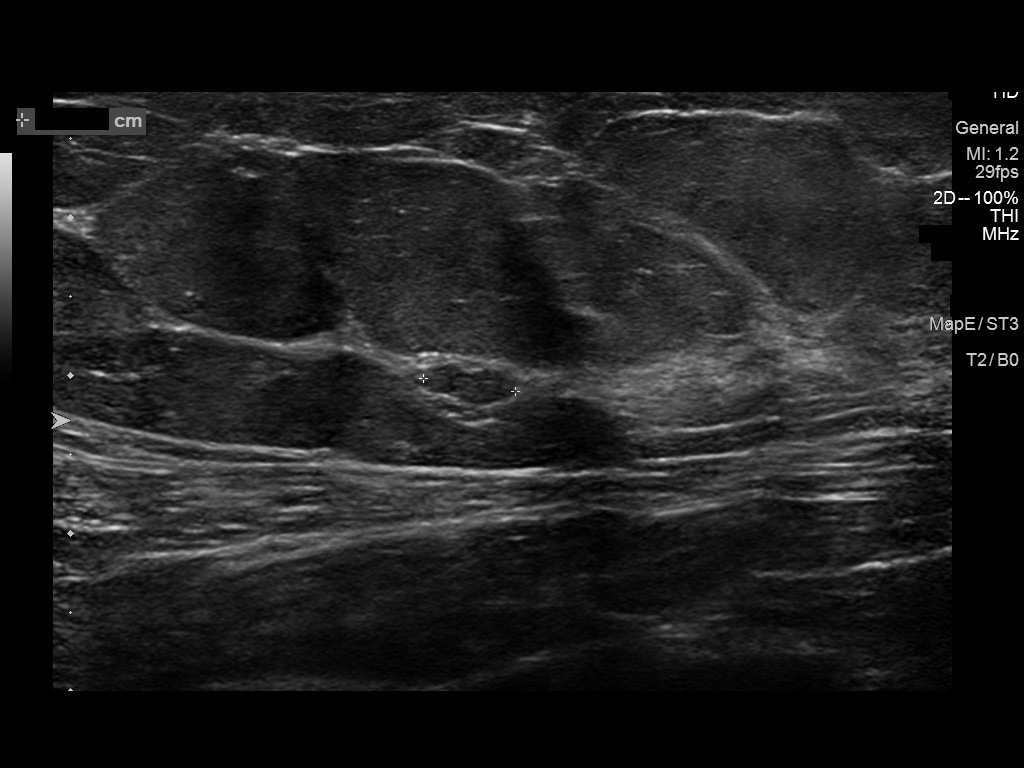

[5 of 5 positions shown; findings below may reference images not displayed]

ACR Breast Density Category b: There are scattered areas of
fibroglandular density.
FINDINGS: Cc and MLO views of bilateral breasts are submitted. No suspicious
identified bilaterally. The breast parenchyma is stable bilaterally.

Targeted ultrasound is performed, showing stable 6 x 3 x 6 mm oval
hypoechoic mass at left breast 2 o'clock 7 cm from nipple unchanged
compared to prior ultrasound.
IMPRESSION: Benign findings.

RECOMMENDATION:
Routine screening in one year.

I have discussed the findings and recommendations with the patient.
If applicable, a reminder letter will be sent to the patient
regarding the next appointment.

BI-RADS CATEGORY  2: Benign.

## 2023-04-08 ENCOUNTER — Other Ambulatory Visit: Payer: Self-pay | Admitting: Internal Medicine

## 2023-04-08 DIAGNOSIS — Z1231 Encounter for screening mammogram for malignant neoplasm of breast: Secondary | ICD-10-CM

## 2023-04-15 ENCOUNTER — Ambulatory Visit
Admission: RE | Admit: 2023-04-15 | Discharge: 2023-04-15 | Disposition: A | Discharge status: Home / Self Care | Payer: Managed Care, Other (non HMO) | Source: Ambulatory Visit | Attending: Internal Medicine | Admitting: Internal Medicine

## 2023-04-15 DIAGNOSIS — Z1231 Encounter for screening mammogram for malignant neoplasm of breast: Secondary | ICD-10-CM | POA: Diagnosis present
